# Patient Record
Sex: Male | Born: 2000 | Race: Black or African American | Hispanic: No | Marital: Single | Smoking: Never smoker
Health system: Southern US, Community
[De-identification: ages and names within clinical notes are randomized; demographics above are authoritative.]

## PROBLEM LIST (undated history)

## (undated) HISTORY — PX: ADENOIDECTOMY: SUR15

## (undated) HISTORY — PX: TYMPANOSTOMY TUBE PLACEMENT: SHX32

## (undated) HISTORY — PX: TONSILLECTOMY: SUR1361

---
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
## 2023-01-19 ENCOUNTER — Ambulatory Visit (HOSPITAL_COMMUNITY)
Admission: EM | Admit: 2023-01-19 | Discharge: 2023-01-20 | Disposition: A | Discharge status: Home / Self Care | Payer: No Payment, Other | Attending: Family | Admitting: Family

## 2023-01-19 DIAGNOSIS — F19959 Other psychoactive substance use, unspecified with psychoactive substance-induced psychotic disorder, unspecified: Secondary | ICD-10-CM | POA: Insufficient documentation

## 2023-01-19 LAB — LIPID PANEL
Cholesterol: 223 mg/dL — ABNORMAL HIGH (ref 0–200)
HDL: 79 mg/dL (ref >40)
LDL Cholesterol: 131 mg/dL — ABNORMAL HIGH (ref 0–99)
Total CHOL/HDL Ratio: 2.8 RATIO
Triglycerides: 63 mg/dL (ref <150)
VLDL: 13 mg/dL (ref 0–40)

## 2023-01-19 LAB — CBC WITH DIFFERENTIAL/PLATELET
Abs Immature Granulocytes: 0.02 10*3/uL (ref 0.00–0.07)
Basophils Absolute: 0 10*3/uL (ref 0.0–0.1)
Basophils Relative: 0 %
Eosinophils Absolute: 0 10*3/uL (ref 0.0–0.5)
Eosinophils Relative: 0 %
HCT: 44.1 % (ref 39.0–52.0)
Hemoglobin: 15.5 g/dL (ref 13.0–17.0)
Immature Granulocytes: 0 %
Lymphocytes Relative: 6 %
Lymphs Abs: 0.4 10*3/uL — ABNORMAL LOW (ref 0.7–4.0)
MCH: 32.2 pg (ref 26.0–34.0)
MCHC: 35.1 g/dL (ref 30.0–36.0)
MCV: 91.7 fL (ref 80.0–100.0)
Monocytes Absolute: 0.2 10*3/uL (ref 0.1–1.0)
Monocytes Relative: 3 %
Neutro Abs: 6.5 10*3/uL (ref 1.7–7.7)
Neutrophils Relative %: 91 %
Platelets: 173 10*3/uL (ref 150–400)
RBC: 4.81 MIL/uL (ref 4.22–5.81)
RDW: 13.6 % (ref 11.5–15.5)
WBC: 7.2 10*3/uL (ref 4.0–10.5)
nRBC: 0 % (ref 0.0–0.2)

## 2023-01-19 LAB — COMPREHENSIVE METABOLIC PANEL
ALT: 45 U/L — ABNORMAL HIGH (ref 0–44)
AST: 22 U/L (ref 15–41)
Albumin: 4.4 g/dL (ref 3.5–5.0)
Alkaline Phosphatase: 66 U/L (ref 38–126)
Anion gap: 11 (ref 5–15)
BUN: 10 mg/dL (ref 6–20)
CO2: 24 mmol/L (ref 22–32)
Calcium: 9.3 mg/dL (ref 8.9–10.3)
Chloride: 102 mmol/L (ref 98–111)
Creatinine, Ser: 0.84 mg/dL (ref 0.61–1.24)
GFR, Estimated: >60 mL/min (ref >60)
Glucose, Bld: 107 mg/dL — ABNORMAL HIGH (ref 70–99)
Potassium: 3.8 mmol/L (ref 3.5–5.1)
Sodium: 137 mmol/L (ref 135–145)
Total Bilirubin: 0.9 mg/dL (ref 0.3–1.2)
Total Protein: 7.4 g/dL (ref 6.5–8.1)

## 2023-01-19 LAB — TSH: TSH: 1.94 u[IU]/mL (ref 0.350–4.500)

## 2023-01-19 MED ORDER — OLANZAPINE 2.5 MG PO TABS: 2.5000 mg | ORAL_TABLET | ORAL | Status: DC

## 2023-01-19 MED ORDER — ACETAMINOPHEN 325 MG PO TABS: 650.0000 mg | ORAL_TABLET | Freq: Four times a day (QID) | ORAL | Status: DC | PRN

## 2023-01-19 MED ORDER — ALUM & MAG HYDROXIDE-SIMETH 200-200-20 MG/5ML PO SUSP: 30.0000 mL | ORAL | Status: DC | PRN

## 2023-01-19 MED ORDER — OLANZAPINE 5 MG PO TABS
5.0000 mg | ORAL_TABLET | Freq: Every day | ORAL | Status: DC
Start: 2023-01-20 — End: 2023-01-19

## 2023-01-19 MED ORDER — OLANZAPINE 5 MG PO TABS
5.0000 mg | ORAL_TABLET | ORAL | Status: DC
  Filled 2023-01-19: qty 1

## 2023-01-19 MED ORDER — LORAZEPAM 1 MG PO TABS: 1.0000 mg | ORAL_TABLET | Freq: Once | ORAL | Status: DC

## 2023-01-19 MED ORDER — MAGNESIUM HYDROXIDE 400 MG/5ML PO SUSP: 30.0000 mL | Freq: Every day | ORAL | Status: DC | PRN

## 2023-01-19 MED ORDER — OLANZAPINE 5 MG PO TABS
5.0000 mg | ORAL_TABLET | Freq: Every day | ORAL | Status: DC
  Administered 2023-01-19: 5 mg via ORAL
  Filled 2023-01-19: qty 1

## 2023-01-19 MED ORDER — TRAZODONE HCL 50 MG PO TABS: 50.0000 mg | ORAL_TABLET | Freq: Every evening | ORAL | Status: DC | PRN

## 2023-01-19 NOTE — ED Notes (Signed)
Pt was trying to clime over the glass in the flex, when staff told him to get down he did.
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
## 2023-01-19 NOTE — ED Notes (Signed)
Patient deiced to take Zyprexa. Notified provider.Marland Kitchen

## 2023-01-19 NOTE — ED Notes (Signed)
Minerva Areola was able to get the patients labs.

## 2023-01-19 NOTE — ED Notes (Signed)
Patient in milieu. Environment is secured. Will continue to monitor for safety. 

## 2023-01-19 NOTE — ED Notes (Signed)
Pt is currently sleeping, no distress noted, environmental check complete, will continue to monitor patient for safety.  

## 2023-01-19 NOTE — ED Notes (Signed)
Rn called lab to see why labs had not resulted. States that they had not receive the blood. Rn called dash dash states that hey dropped the blood off.at 1630. States that they had put blood in a cytology bin. Lab looked in bin and  states that they do have they blood now so they will run the labs.

## 2023-01-19 NOTE — ED Notes (Signed)
[facility] lab courier called to transport labs to [facility] lab 

## 2023-01-19 NOTE — ED Notes (Signed)
Pts mother Bonita Quin can be reached at [phone].

## 2023-01-19 NOTE — ED Notes (Signed)
Provider is aware that patient did not have EKG.

## 2023-01-19 NOTE — ED Provider Notes (Signed)
Faxed IVC to magistrate, however it was rejected twice. Envelop# [id number] and [id number]

## 2023-01-19 NOTE — ED Notes (Signed)
Patient states that the can not void at this time.

## 2023-01-19 NOTE — ED Notes (Signed)
Pt  admitted to obs. Denies  SI/HI endorses /AVH. Calm, cooperative throughout interview process. Skin assessment completed. Oriented to unit. Meal and drink offer. Pt verbally contacted for safety. Will monitor for safety.Items are in locker 17

## 2023-01-19 NOTE — ED Notes (Signed)
Patient refused zyprexa . Rn notified  provider.

## 2023-01-20 MED ORDER — OLANZAPINE 5 MG PO TBDP: 5.0000 mg | ORAL_TABLET | Freq: Every day | ORAL | 0 refills | Status: DC

## 2023-01-20 NOTE — ED Notes (Signed)
Patient A&O x 4, ambulatory. Patient discharged in no acute distress with mother. Patient denied SI/HI, A/VH upon discharge. Patient and mother verbalized understanding of all discharge instructions explained by staff and provider, to include follow up appointments, RX's and safety plan. Patient reported mood 10/10.  Pt belongings returned to patient from locker #  [id number]. Patient escorted to lobby via staff for transport to destination. Safety maintained.

## 2023-01-20 NOTE — ED Notes (Signed)
Dr. Lucianne Muss and Arlina Robes, MD at  bedside.

## 2023-01-20 NOTE — ED Notes (Signed)
Patient A&Ox3. Patient is pleasant at this time. Patient answers question appropriately at this time. Patient denies SI/HI and AVH. Patient denies any physical complaints when asked. No acute distress noted. Support and encouragement provided. Routine safety checks conducted according to facility protocol. Encouraged patient to notify staff if thoughts of harm toward self or others arise. Patient verbalize understanding and agreement. Will continue to monitor for safety.

## 2023-01-20 NOTE — ED Notes (Signed)
Patient resting quietly in bed with eyes closed. Respirations equal and unlabored, skin warm and dry, NAD. Routine safety checks conducted according to facility protocol. Will continue to monitor for safety.  

## 2023-01-20 NOTE — ED Notes (Signed)
Pt is currently sleeping, no distress noted, environmental check complete, will continue to monitor patient for safety.  
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
## 2023-01-20 NOTE — Discharge Instructions (Addendum)
Follow-up recommendations:  Activity:  Normal, as tolerated Diet:  Per PCP recommendation Continue taking olanzapine 5 mg at night time   Patient is instructed prior to discharge to: Take all medications as prescribed by her mental healthcare provider. Report any adverse effects and/or reactions from the medicines to her outpatient provider promptly. Patient has been instructed & cautioned: To not engage in alcohol and or illegal drug use while on prescription medicines.  In the event of worsening symptoms, patient is instructed to call the crisis hotline at 988, 911 and or go to the nearest ED for appropriate evaluation and treatment of symptoms. To follow-up with her primary care provider for your other medical issues, concerns and or health care needs.  Base on the information you have provided and the presenting issue, outpatient services and resources for have been recommended.  It is imperative that you follow through with treatment recommendations within 5-7 days from the of discharge to mitigate further risk to your safety and mental well-being. A list of referrals has been provided below to get you started.  You are not limited to the list provided.  In case of an urgent crisis, you may contact the Mobile Crisis Unit with [facility] at [phone].    [facility] [address], [place], [address] [phone] phone   New Patient Assessment/Therapy Walk-Ins:  Monday and Wednesday: 8 am until slots are full. Every 1st and 2nd Fridays of the month: 1 pm - 5 pm.  NO ASSESSMENT/THERAPY WALK-INS ON TUESDAYS OR THURSDAYS  New Patient Assessment/Medication Management Walk-Ins:  Monday - Friday:  8 am - 11 am.  For all walk-ins, we ask that you arrive by 7:30 am because patients will be seen in the order of arrival.  Availability is limited; therefore, you may not be seen on the same day that you walk-in.  Our goal is to serve and  meet the needs of our community to the best of our ability.

## 2024-02-27 ENCOUNTER — Ambulatory Visit (INDEPENDENT_AMBULATORY_CARE_PROVIDER_SITE_OTHER)
Admission: EM | Admit: 2024-02-27 | Discharge: 2024-02-28 | Disposition: A | Discharge status: Other Acute Inpatient Hospital

## 2024-02-27 DIAGNOSIS — R45850 Homicidal ideations: Secondary | ICD-10-CM | POA: Insufficient documentation

## 2024-02-27 DIAGNOSIS — F32A Depression, unspecified: Secondary | ICD-10-CM | POA: Insufficient documentation

## 2024-02-27 DIAGNOSIS — F19959 Other psychoactive substance use, unspecified with psychoactive substance-induced psychotic disorder, unspecified: Secondary | ICD-10-CM | POA: Insufficient documentation

## 2024-02-27 DIAGNOSIS — F1994 Other psychoactive substance use, unspecified with psychoactive substance-induced mood disorder: Secondary | ICD-10-CM

## 2024-02-27 DIAGNOSIS — R4689 Other symptoms and signs involving appearance and behavior: Secondary | ICD-10-CM

## 2024-02-27 LAB — POCT URINE DRUG SCREEN - MANUAL ENTRY (I-SCREEN)
POC Amphetamine UR: NOT DETECTED
POC Buprenorphine (BUP): NOT DETECTED
POC Cocaine UR: NOT DETECTED
POC Marijuana UR: NOT DETECTED
POC Methadone UR: NOT DETECTED
POC Methamphetamine UR: NOT DETECTED
POC Morphine: NOT DETECTED
POC Oxazepam (BZO): NOT DETECTED
POC Oxycodone UR: NOT DETECTED
POC Secobarbital (BAR): NOT DETECTED

## 2024-02-27 LAB — COMPREHENSIVE METABOLIC PANEL WITH GFR
ALT: 19 U/L (ref 0–44)
AST: 18 U/L (ref 15–41)
Albumin: 4 g/dL (ref 3.5–5.0)
Alkaline Phosphatase: 71 U/L (ref 38–126)
Anion gap: 11 (ref 5–15)
BUN: 14 mg/dL (ref 6–20)
CO2: 25 mmol/L (ref 22–32)
Calcium: 8.9 mg/dL (ref 8.9–10.3)
Chloride: 104 mmol/L (ref 98–111)
Creatinine, Ser: 0.92 mg/dL (ref 0.61–1.24)
GFR, Estimated: >60 mL/min (ref >60)
Glucose, Bld: 72 mg/dL (ref 70–99)
Potassium: 3.7 mmol/L (ref 3.5–5.1)
Sodium: 140 mmol/L (ref 135–145)
Total Bilirubin: 0.7 mg/dL (ref 0.0–1.2)
Total Protein: 7.1 g/dL (ref 6.5–8.1)

## 2024-02-27 LAB — CBC WITH DIFFERENTIAL/PLATELET
Abs Immature Granulocytes: 0.02 K/uL (ref 0.00–0.07)
Basophils Absolute: 0 K/uL (ref 0.0–0.1)
Basophils Relative: 1 %
Eosinophils Absolute: 0 K/uL (ref 0.0–0.5)
Eosinophils Relative: 1 %
HCT: 45.1 % (ref 39.0–52.0)
Hemoglobin: 15.5 g/dL (ref 13.0–17.0)
Immature Granulocytes: 0 %
Lymphocytes Relative: 20 %
Lymphs Abs: 1.2 K/uL (ref 0.7–4.0)
MCH: 31.2 pg (ref 26.0–34.0)
MCHC: 34.4 g/dL (ref 30.0–36.0)
MCV: 90.7 fL (ref 80.0–100.0)
Monocytes Absolute: 0.5 K/uL (ref 0.1–1.0)
Monocytes Relative: 8 %
Neutro Abs: 4.1 K/uL (ref 1.7–7.7)
Neutrophils Relative %: 70 %
Platelets: 162 K/uL (ref 150–400)
RBC: 4.97 MIL/uL (ref 4.22–5.81)
RDW: 12.9 % (ref 11.5–15.5)
WBC: 5.9 K/uL (ref 4.0–10.5)
nRBC: 0 % (ref 0.0–0.2)

## 2024-02-27 LAB — LIPID PANEL
Cholesterol: 214 mg/dL — ABNORMAL HIGH (ref 0–200)
HDL: 65 mg/dL (ref >40)
LDL Cholesterol: 131 mg/dL — ABNORMAL HIGH (ref 0–99)
Total CHOL/HDL Ratio: 3.3 ratio
Triglycerides: 90 mg/dL (ref <150)
VLDL: 18 mg/dL (ref 0–40)

## 2024-02-27 LAB — ETHANOL: Alcohol, Ethyl (B): <15 mg/dL (ref <15)

## 2024-02-27 MED ORDER — DIPHENHYDRAMINE HCL 50 MG/ML IJ SOLN: 50.0000 mg | Freq: Three times a day (TID) | INTRAMUSCULAR | Status: DC | PRN

## 2024-02-27 MED ORDER — HALOPERIDOL LACTATE 5 MG/ML IJ SOLN: 10.0000 mg | Freq: Three times a day (TID) | INTRAMUSCULAR | Status: DC | PRN

## 2024-02-27 MED ORDER — TRAZODONE HCL 50 MG PO TABS: 50.0000 mg | ORAL_TABLET | Freq: Every evening | ORAL | Status: DC | PRN

## 2024-02-27 MED ORDER — DIPHENHYDRAMINE HCL 50 MG PO CAPS: 50.0000 mg | ORAL_CAPSULE | Freq: Three times a day (TID) | ORAL | Status: DC | PRN

## 2024-02-27 MED ORDER — HYDROXYZINE HCL 25 MG PO TABS: 25.0000 mg | ORAL_TABLET | Freq: Three times a day (TID) | ORAL | Status: DC | PRN

## 2024-02-27 MED ORDER — HALOPERIDOL 5 MG PO TABS: 5.0000 mg | ORAL_TABLET | Freq: Three times a day (TID) | ORAL | Status: DC | PRN

## 2024-02-27 MED ORDER — LORAZEPAM 2 MG/ML IJ SOLN: 2.0000 mg | Freq: Three times a day (TID) | INTRAMUSCULAR | Status: DC | PRN

## 2024-02-27 MED ORDER — ALUM & MAG HYDROXIDE-SIMETH 200-200-20 MG/5ML PO SUSP: 30.0000 mL | ORAL | Status: DC | PRN

## 2024-02-27 MED ORDER — HALOPERIDOL LACTATE 5 MG/ML IJ SOLN: 5.0000 mg | Freq: Three times a day (TID) | INTRAMUSCULAR | Status: DC | PRN

## 2024-02-27 MED ORDER — ACETAMINOPHEN 325 MG PO TABS: 650.0000 mg | ORAL_TABLET | Freq: Four times a day (QID) | ORAL | Status: DC | PRN

## 2024-02-27 MED ORDER — MAGNESIUM HYDROXIDE 400 MG/5ML PO SUSP: 30.0000 mL | Freq: Every day | ORAL | Status: DC | PRN

## 2024-02-27 MED ORDER — OLANZAPINE 5 MG PO TBDP
5.0000 mg | ORAL_TABLET | Freq: Every day | ORAL | Status: DC
  Administered 2024-02-27: 5 mg via ORAL
  Filled 2024-02-27: qty 1

## 2024-02-27 NOTE — ED Notes (Signed)
 Patient currently resting in recliner. RR even and unlabored, appearing in no noted distress. Environmental check complete

## 2024-02-27 NOTE — ED Notes (Addendum)
 Mario Perez arrived to the [facility] under IVC. Patient denies any suicidal ideations or homicidal ideations. Pt contracts for safety. Patient denies any auditory hallucinations, visual hallucinations, or CAH. Skin check conducted by Corean PEAK and Ellouise, MHT. Patient oriented to the unit and offered food and Patient refused. Patient offered cup of water. Patient currently resting in bed. No distress noted.
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
## 2024-02-28 ENCOUNTER — Encounter (HOSPITAL_COMMUNITY): Payer: Self-pay

## 2024-02-28 ENCOUNTER — Other Ambulatory Visit: Payer: Self-pay

## 2024-02-28 ENCOUNTER — Inpatient Hospital Stay (HOSPITAL_COMMUNITY)
Admission: AD | Admit: 2024-02-28 | Discharge: 2024-03-03 | DRG: 885 | Disposition: A | Discharge status: Home / Self Care | Source: Intra-hospital | Attending: Student in an Organized Health Care Education/Training Program | Admitting: Student in an Organized Health Care Education/Training Program

## 2024-02-28 DIAGNOSIS — Z825 Family history of asthma and other chronic lower respiratory diseases: Secondary | ICD-10-CM

## 2024-02-28 DIAGNOSIS — R45850 Homicidal ideations: Secondary | ICD-10-CM | POA: Diagnosis present

## 2024-02-28 DIAGNOSIS — F1994 Other psychoactive substance use, unspecified with psychoactive substance-induced mood disorder: Principal | ICD-10-CM | POA: Diagnosis present

## 2024-02-28 DIAGNOSIS — Z79899 Other long term (current) drug therapy: Secondary | ICD-10-CM

## 2024-02-28 DIAGNOSIS — Z62810 Personal history of physical and sexual abuse in childhood: Secondary | ICD-10-CM | POA: Diagnosis not present

## 2024-02-28 DIAGNOSIS — Z800 Family history of malignant neoplasm of digestive organs: Secondary | ICD-10-CM | POA: Diagnosis not present

## 2024-02-28 DIAGNOSIS — F39 Unspecified mood [affective] disorder: Secondary | ICD-10-CM | POA: Diagnosis present

## 2024-02-28 DIAGNOSIS — F1220 Cannabis dependence, uncomplicated: Secondary | ICD-10-CM | POA: Diagnosis present

## 2024-02-28 DIAGNOSIS — Z560 Unemployment, unspecified: Secondary | ICD-10-CM | POA: Diagnosis not present

## 2024-02-28 DIAGNOSIS — Z833 Family history of diabetes mellitus: Secondary | ICD-10-CM

## 2024-02-28 DIAGNOSIS — F419 Anxiety disorder, unspecified: Secondary | ICD-10-CM | POA: Diagnosis present

## 2024-02-28 DIAGNOSIS — Z638 Other specified problems related to primary support group: Secondary | ICD-10-CM | POA: Diagnosis not present

## 2024-02-28 DIAGNOSIS — R4689 Other symptoms and signs involving appearance and behavior: Secondary | ICD-10-CM | POA: Diagnosis not present

## 2024-02-28 DIAGNOSIS — Z8249 Family history of ischemic heart disease and other diseases of the circulatory system: Secondary | ICD-10-CM | POA: Diagnosis not present

## 2024-02-28 DIAGNOSIS — F209 Schizophrenia, unspecified: Secondary | ICD-10-CM | POA: Diagnosis present

## 2024-02-28 DIAGNOSIS — F4310 Post-traumatic stress disorder, unspecified: Secondary | ICD-10-CM | POA: Diagnosis present

## 2024-02-28 DIAGNOSIS — Z62820 Parent-biological child conflict: Secondary | ICD-10-CM | POA: Diagnosis not present

## 2024-02-28 DIAGNOSIS — Z62811 Personal history of psychological abuse in childhood: Secondary | ICD-10-CM

## 2024-02-28 DIAGNOSIS — Z5982 Transportation insecurity: Secondary | ICD-10-CM

## 2024-02-28 DIAGNOSIS — F17290 Nicotine dependence, other tobacco product, uncomplicated: Secondary | ICD-10-CM | POA: Diagnosis present

## 2024-02-28 DIAGNOSIS — F32A Depression, unspecified: Secondary | ICD-10-CM | POA: Diagnosis present

## 2024-02-28 DIAGNOSIS — J45909 Unspecified asthma, uncomplicated: Secondary | ICD-10-CM | POA: Diagnosis present

## 2024-02-28 MED ORDER — TRAZODONE HCL 50 MG PO TABS
50.0000 mg | ORAL_TABLET | Freq: Every evening | ORAL | Status: DC | PRN
  Filled 2024-02-28: qty 1

## 2024-02-28 MED ORDER — HALOPERIDOL LACTATE 5 MG/ML IJ SOLN: 10.0000 mg | Freq: Three times a day (TID) | INTRAMUSCULAR | Status: DC | PRN

## 2024-02-28 MED ORDER — ACETAMINOPHEN 325 MG PO TABS: 650.0000 mg | ORAL_TABLET | Freq: Four times a day (QID) | ORAL | Status: DC | PRN

## 2024-02-28 MED ORDER — DIPHENHYDRAMINE HCL 50 MG/ML IJ SOLN: 50.0000 mg | Freq: Three times a day (TID) | INTRAMUSCULAR | Status: DC | PRN

## 2024-02-28 MED ORDER — HALOPERIDOL 5 MG PO TABS: 5.0000 mg | ORAL_TABLET | Freq: Three times a day (TID) | ORAL | Status: DC | PRN

## 2024-02-28 MED ORDER — TRAZODONE HCL 50 MG PO TABS: 50.0000 mg | ORAL_TABLET | Freq: Every evening | ORAL | Status: DC | PRN

## 2024-02-28 MED ORDER — HALOPERIDOL LACTATE 5 MG/ML IJ SOLN: 5.0000 mg | Freq: Three times a day (TID) | INTRAMUSCULAR | Status: DC | PRN

## 2024-02-28 MED ORDER — MAGNESIUM HYDROXIDE 400 MG/5ML PO SUSP: 30.0000 mL | Freq: Every day | ORAL | Status: DC | PRN

## 2024-02-28 MED ORDER — DIPHENHYDRAMINE HCL 25 MG PO CAPS: 50.0000 mg | ORAL_CAPSULE | Freq: Three times a day (TID) | ORAL | Status: DC | PRN

## 2024-02-28 MED ORDER — OLANZAPINE 5 MG PO TBDP
5.0000 mg | ORAL_TABLET | Freq: Every day | ORAL | Status: DC
  Filled 2024-02-28: qty 1

## 2024-02-28 MED ORDER — LORAZEPAM 2 MG/ML IJ SOLN: 2.0000 mg | Freq: Three times a day (TID) | INTRAMUSCULAR | Status: DC | PRN

## 2024-02-28 MED ORDER — ALUM & MAG HYDROXIDE-SIMETH 200-200-20 MG/5ML PO SUSP: 30.0000 mL | ORAL | Status: DC | PRN

## 2024-02-28 MED ORDER — HYDROXYZINE HCL 25 MG PO TABS
25.0000 mg | ORAL_TABLET | Freq: Three times a day (TID) | ORAL | Status: DC | PRN
  Filled 2024-02-28: qty 1
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
## 2024-02-28 NOTE — ED Notes (Signed)
 Patient has been quiet and calm all day.  He has been asleep most of the day awaking for lunch.  Patient to be transferred to [facility] later this evening.  Will monitor.
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
## 2024-02-28 NOTE — ED Notes (Signed)
 Patient currently sleeping and resting in recliner. RR even and unlabored, appearing in no noted distress. Environmental check complete

## 2024-02-28 NOTE — Progress Notes (Signed)
 BHH/BMU LCSW Progress Note   02/28/2024    10:55 AM  Mario Perez   [id number]   Type of Contact and Topic:  Psychiatric Bed Placement   Pt accepted to [facility] 405-2    Patient meets inpatient criteria per Roxianne Olp, NP     The attending provider will be Dr. Prentis  Call report to [phone]    Berwyn Acosta, RN @ [facility] ED notified.     Pt scheduled  to arrive at [facility] TODAY @ 2000.    Bunnie Gallop, MSW, LCSW-A  10:56 AM 02/28/2024

## 2024-02-28 NOTE — Plan of Care (Signed)
  Problem: Education: Goal: Knowledge of [facility] Education information/materials will improve Outcome: Progressing Goal: Emotional status will improve Outcome: Progressing Goal: Mental status will improve Outcome: Progressing Goal: Verbalization of understanding the information provided will improve Outcome: Progressing   Problem: Activity: Goal: Interest or engagement in activities will improve Outcome: Progressing Goal: Sleeping patterns will improve Outcome: Progressing   Problem: Coping: Goal: Ability to verbalize frustrations and anger appropriately will improve Outcome: Progressing Goal: Ability to demonstrate self-control will improve Outcome: Progressing   Problem: Health Behavior/Discharge Planning: Goal: Identification of resources available to assist in meeting health care needs will improve Outcome: Progressing Goal: Compliance with treatment plan for underlying cause of condition will improve Outcome: Progressing   Problem: Physical Regulation: Goal: Ability to maintain clinical measurements within normal limits will improve Outcome: Progressing   Problem: Safety: Goal: Periods of time without injury will increase Outcome: Progressing   Problem: Education: Goal: Knowledge of [facility] Education information/materials will improve Outcome: Progressing Goal: Emotional status will improve Outcome: Progressing Goal: Mental status will improve Outcome: Progressing Goal: Verbalization of understanding the information provided will improve Outcome: Progressing   Problem: Activity: Goal: Interest or engagement in activities will improve Outcome: Progressing Goal: Sleeping patterns will improve Outcome: Progressing   Problem: Coping: Goal: Ability to verbalize frustrations and anger appropriately will improve Outcome: Progressing Goal: Ability to demonstrate self-control will improve Outcome: Progressing   Problem: Health  Behavior/Discharge Planning: Goal: Identification of resources available to assist in meeting health care needs will improve Outcome: Progressing Goal: Compliance with treatment plan for underlying cause of condition will improve Outcome: Progressing   Problem: Physical Regulation: Goal: Ability to maintain clinical measurements within normal limits will improve Outcome: Progressing   Problem: Safety: Goal: Periods of time without injury will increase Outcome: Progressing

## 2024-02-28 NOTE — ED Notes (Signed)
 Pt is reports being irritable and frustrated about being in facility.  Denies SI,HI, AVH, and pain.  Reported he will not  be taking his evening medications tonight.  He is aware he will be going to [facility].  Report has been called and [employer] will transport.  Will continue to monitor for safety and escalating behaviors.

## 2024-02-28 NOTE — Progress Notes (Signed)
 Admission Note patient is a  IVC 23 year old male. Pt was calm and cooperative during assessment. Denies SI/HI/AVH. Admission plan of care reviewed with pt, consent signed.  Personal belongings/skin assessment completed.   No contraband found.  Patient oriented to the unit, staff and room.  Routine safety checks initiated.  Verbalizes understanding of unit rules and protocols.  Patient is presently safe on the unit. No unsafe behaviors noted.  Q 15 minute safety checks maintained per unit protocol.

## 2024-02-29 DIAGNOSIS — F209 Schizophrenia, unspecified: Principal | ICD-10-CM | POA: Diagnosis present

## 2024-02-29 DIAGNOSIS — F1220 Cannabis dependence, uncomplicated: Secondary | ICD-10-CM | POA: Diagnosis present

## 2024-02-29 DIAGNOSIS — F39 Unspecified mood [affective] disorder: Principal | ICD-10-CM | POA: Diagnosis present

## 2024-02-29 LAB — HEMOGLOBIN A1C
Hgb A1c MFr Bld: 5.6 % (ref 4.8–5.6)
Mean Plasma Glucose: 114.02 mg/dL

## 2024-02-29 LAB — TSH: TSH: 1.776 u[IU]/mL (ref 0.350–4.500)

## 2024-02-29 NOTE — Group Note (Signed)
 Date:  02/29/2024 Time:  8:22 PM  Group Topic/Focus:  Goals Group:   The focus of this group is to help patients establish daily goals to achieve during treatment and discuss how the patient can incorporate goal setting into their daily lives to aide in recovery. Wrap-Up Group:   The focus of this group is to help patients review their daily goal of treatment and discuss progress on daily workbooks.    Participation Level:  Did Not Attend  Participation Quality:    Affect:    Cognitive:    Insight:   Engagement in Group:    Modes of Intervention:    Additional Comments:    Mario Perez 02/29/2024, 8:22 PM
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
## 2024-02-29 NOTE — Group Note (Signed)
 Date:  02/29/2024 Time:  5:33 PM  Group Topic/Focus:  Emotional Education:   The focus of this group is to discuss what feelings/emotions are, and how they are experienced.    Participation Level:  Did Not Attend      Huel Mall 02/29/2024, 5:33 PM

## 2024-02-29 NOTE — Plan of Care (Signed)
   Problem: Education: Goal: Knowledge of [facility] Education information/materials will improve Outcome: Progressing Goal: Emotional status will improve Outcome: Progressing Goal: Mental status will improve Outcome: Progressing Goal: Verbalization of understanding the information provided will improve Outcome: Progressing
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
## 2024-02-29 NOTE — Group Note (Deleted)
 Date:  02/29/2024 Time:  5:45 PM  Group Topic/Focus:  Emotional Education:   The focus of this group is to discuss what feelings/emotions are, and how they are experienced.     Participation Level:  {BHH PARTICIPATION OZCZO:77735}  Participation Quality:  {BHH PARTICIPATION QUALITY:22265}  Affect:  {BHH AFFECT:22266}  Cognitive:  {BHH COGNITIVE:22267}  Insight: {BHH Insight2:20797}  Engagement in Group:  {BHH ENGAGEMENT IN HMNLE:77731}  Modes of Intervention:  {BHH MODES OF INTERVENTION:22269}  Additional Comments:  ***  Mario Perez 02/29/2024, 5:45 PM

## 2024-02-29 NOTE — Progress Notes (Signed)
   02/29/24 0831  Psych Admission Type (Psych Patients Only)  Admission Status Involuntary  Psychosocial Assessment  Patient Complaints Irritability  Eye Contact Brief  Facial Expression Blank  Affect Flat  Speech Soft  Interaction Minimal  Motor Activity Other (Comment) (WDL)  Appearance/Hygiene Unremarkable  Behavior Characteristics Cooperative  Mood Irritable  Thought Process  Coherency WDL  Content WDL  Delusions None reported or observed  Perception WDL  Hallucination None reported or observed  Judgment WDL  Confusion WDL  Danger to Self  Current suicidal ideation? Denies  Agreement Not to Harm Self Yes  Description of Agreement verbal  Danger to Others  Danger to Others None reported or observed
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
## 2024-03-01 ENCOUNTER — Encounter (HOSPITAL_COMMUNITY): Payer: Self-pay

## 2024-03-01 DIAGNOSIS — F209 Schizophrenia, unspecified: Secondary | ICD-10-CM

## 2024-03-01 MED ORDER — FLUOXETINE HCL 10 MG PO CAPS
10.0000 mg | ORAL_CAPSULE | Freq: Every day | ORAL | Status: DC
  Administered 2024-03-01: 10 mg via ORAL
  Filled 2024-03-01 (×3): qty 1

## 2024-03-01 NOTE — Group Note (Signed)
 Date:  03/01/2024 Time:  12:01 PM  Group Topic/Focus:  Emotional and Physical Wellness Education:   The purpose of this group is to identify and challenge unhealthy thought patterns key to emotional wellness. The connection between emotional and physical wellness was also discussed.     Participation Level:  Minimal  Participation Quality:  Appropriate  Affect:  Appropriate  Cognitive:  Appropriate  Insight: Limited  Engagement in Group:  Developing/Improving  Modes of Intervention:  Discussion and Education  Additional Comments:    Mario Perez 03/01/2024, 12:01 PM

## 2024-03-01 NOTE — Progress Notes (Addendum)
 Mom called and would like to speak with the MD [phone]. Pt  gave verbal consent for the provider to speak to her. Notified Leita Arts MD Notified MD Arts patient refused Zyprexa  last night.

## 2024-03-01 NOTE — Plan of Care (Signed)
   Problem: Education: Goal: Knowledge of [facility] Education information/materials will improve Outcome: Progressing Goal: Emotional status will improve Outcome: Progressing Goal: Mental status will improve Outcome: Progressing Goal: Verbalization of understanding the information provided will improve Outcome: Progressing

## 2024-03-01 NOTE — [facility] Suicide Risk Assessment (Signed)
 [facility] INPATIENT:  Family/Significant Other Suicide Prevention Education  Suicide Prevention Education:  Education Completed; Halden Phegley (mother) [phone],  (name of family member/significant other) has been identified by the patient as the family member/significant other with whom the patient will be residing, and identified as the person(s) who will aid the patient in the event of a mental health crisis (suicidal ideations/suicide attempt).  With written consent from the patient, the family member/significant other has been provided the following suicide prevention education, prior to the and/or following the discharge of the patient.  The suicide prevention education provided includes the following: Suicide risk factors Suicide prevention and interventions National Suicide Hotline telephone number [facility] assessment telephone number [facility] Emergency Assistance 911 [facility] and/or Residential Mobile Crisis Unit telephone number  Request made of family/significant other to: Remove weapons (e.g., guns, rifles, knives), all items previously/currently identified as safety concern.   Remove drugs/medications (over-the-counter, prescriptions, illicit drugs), all items previously/currently identified as a safety concern.  Mother, Aarya Quebedeaux, states patient owns a gun but she was able to confiscate it and remove it from the home. Patient no longer has access to this gun, no other weapons are in the home. Rock also agrees to safely store/lock up OTC/prescription medications away from patient. Rock agrees to allow patient back to the home (address on file) upon discharge.   The family member/significant other verbalizes understanding of the suicide prevention education information provided.  The family member/significant other agrees to remove the items of safety concern listed above.  Louetta Lame 03/01/2024, 12:57 PM

## 2024-03-01 NOTE — Group Note (Deleted)
 Date:  03/01/2024 Time:  8:07 PM  Group Topic/Focus:       Participation Level:  {[facility] PARTICIPATION OZCZO:77735}  Participation Quality:  {[facility] PARTICIPATION QUALITY:22265}  Affect:  {[facility] AFFECT:22266}  Cognitive:  {[facility] COGNITIVE:22267}  Insight: {[facility] Insight2:20797}  Engagement in Group:  {[facility] ENGAGEMENT IN HMNLE:77731}  Modes of Intervention:  {[facility] MODES OF INTERVENTION:22269}  Additional Comments:  ***  Phoenyx Paulsen A Dontavious Emily 03/01/2024, 8:07 PM

## 2024-03-01 NOTE — Group Note (Signed)
 Recreation Therapy Group Note   Group Topic:Team Building  Group Date: 03/01/2024 Start Time: 0930 End Time: 0958 Facilitators: Mikael Debell-McCall, LRT,CTRS Location: [address] Dayroom   Group Topic: Communication, Team Building, Problem Solving  Goal Area(s) Addresses:  Patient will effectively work with peer towards shared goal.  Patient will identify skills used to make activity successful.  Patient will identify how skills used during activity can be applied to reach post d/c goals.   Behavioral Response:   Intervention: STEM Activity- Glass blower/designer  Activity: Tallest [employer]. In teams of 5-6, patients were given 11 craft pipe cleaners. Using the materials provided, patients were instructed to compete again the opposing team(s) to build the tallest free-standing structure from floor level. The activity was timed; difficulty increased by Clinical research associate as Production designer, theatre/television/film continued.  Systematically resources were removed with additional directions for example, placing one arm behind their back, working in silence, and shape stipulations. LRT facilitated post-activity discussion reviewing team processes and necessary communication skills involved in completion. Patients were encouraged to reflect how the skills utilized, or not utilized, in this activity can be incorporated to positively impact support systems post discharge.  Education: Pharmacist, community, Scientist, physiological, Discharge Planning   Education Outcome: Acknowledges education/In group clarification offered/Needs additional education.    Affect/Mood: N/A   Participation Level: Did not attend    Clinical Observations/Individualized Feedback:      Plan: Continue to engage patient in RT group sessions 2-3x/week.   Liona Wengert-McCall, LRT,CTRS 03/01/2024 12:49 PM
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
## 2024-03-01 NOTE — Group Note (Signed)
 Date:  03/01/2024 Time:  4:00 PM  Group Topic/Focus:  Goals Group:   The focus of this group is to help patients establish daily goals to achieve during treatment and discuss how the patient can incorporate goal setting into their daily lives to aide in recovery. Orientation:   The focus of this group is to educate the patient on the purpose and policies of crisis stabilization and provide a format to answer questions about their admission.  The group details unit policies and expectations of patients while admitted.    Participation Level:  Did Not Attend   Mario Perez Mars 03/01/2024, 4:00 PM

## 2024-03-01 NOTE — Progress Notes (Signed)
(  Sleep Hours) -8.0 (Any PRNs that were needed, meds refused, or side effects to meds)- refused zyprexa  (Any disturbances and when (visitation, over night)-none (Concerns raised by the patient)-Pt does not want to take any medications  (SI/HI/AVH)- Denies all

## 2024-03-01 NOTE — BH IP Treatment Plan (Signed)
 Interdisciplinary Treatment and Diagnostic Plan Update  03/01/2024 Time of Session: 10:35AM Mario Perez MRN: [id number]  Principal Diagnosis: Unspecified mood (affective) disorder (HCC)  Secondary Diagnoses: Principal Problem:   Unspecified mood (affective) disorder (HCC) Active Problems:   Cannabis use disorder, severe, dependence (HCC)   Current Medications:  Current Facility-Administered Medications  Medication Dose Route Frequency Provider Last Rate Last Admin   acetaminophen  (TYLENOL ) tablet 650 mg  650 mg Oral Q6H PRN Nkwenti, Doris, NP       acetaminophen  (TYLENOL ) tablet 650 mg  650 mg Oral Q6H PRN Nkwenti, Doris, NP       alum & mag hydroxide-simeth (MAALOX/MYLANTA) 200-200-20 MG/5ML suspension 30 mL  30 mL Oral Q4H PRN Nkwenti, Doris, NP       alum & mag hydroxide-simeth (MAALOX/MYLANTA) 200-200-20 MG/5ML suspension 30 mL  30 mL Oral Q4H PRN Nkwenti, Doris, NP       haloperidol  (HALDOL ) tablet 5 mg  5 mg Oral TID PRN Tex Drilling, NP       And   diphenhydrAMINE  (BENADRYL ) capsule 50 mg  50 mg Oral TID PRN Tex Drilling, NP       haloperidol  lactate (HALDOL ) injection 10 mg  10 mg Intramuscular TID PRN Tex Drilling, NP       And   diphenhydrAMINE  (BENADRYL ) injection 50 mg  50 mg Intramuscular TID PRN Tex Drilling, NP       And   LORazepam  (ATIVAN ) injection 2 mg  2 mg Intramuscular TID PRN Tex Drilling, NP       haloperidol  lactate (HALDOL ) injection 5 mg  5 mg Intramuscular TID PRN Tex Drilling, NP       And   diphenhydrAMINE  (BENADRYL ) injection 50 mg  50 mg Intramuscular TID PRN Tex Drilling, NP       And   LORazepam  (ATIVAN ) injection 2 mg  2 mg Intramuscular TID PRN Tex Drilling, NP       FLUoxetine  (PROZAC ) capsule 10 mg  10 mg Oral Daily Towana Leita SAILOR, MD   10 mg at 03/01/24 1406   hydrOXYzine  (ATARAX ) tablet 25 mg  25 mg Oral TID PRN Tex Drilling, NP       magnesium  hydroxide (MILK OF MAGNESIA) suspension 30 mL  30 mL Oral Daily PRN Nkwenti,  Doris, NP       magnesium  hydroxide (MILK OF MAGNESIA) suspension 30 mL  30 mL Oral Daily PRN Nkwenti, Doris, NP       traZODone  (DESYREL ) tablet 50 mg  50 mg Oral QHS PRN Nkwenti, Doris, NP       traZODone  (DESYREL ) tablet 50 mg  50 mg Oral QHS PRN Tex Drilling, NP       PTA Medications: Medications Prior to Admission  Medication Sig Dispense Refill Last Dose/Taking   OLANZapine  zydis (ZYPREXA  ZYDIS) 5 MG disintegrating tablet Take 1 tablet (5 mg total) by mouth at bedtime. (Patient not taking: Reported on 02/28/2024) 30 tablet 0     Patient Stressors: Marital or family conflict    Patient Strengths: Supportive family/friends   Treatment Modalities: Medication Management, Group therapy, Case management,  1 to 1 session with clinician, Psychoeducation, Recreational therapy.   Physician Treatment Plan for Primary Diagnosis: Unspecified mood (affective) disorder (HCC) Long Term Goal(s):     Short Term Goals:    Medication Management: Evaluate patient's response, side effects, and tolerance of medication regimen.  Therapeutic Interventions: 1 to 1 sessions, Unit Group sessions and Medication administration.  Evaluation of Outcomes: Not Progressing  Physician Treatment Plan for Secondary Diagnosis: Principal Problem:   Unspecified mood (affective) disorder (HCC) Active Problems:   Cannabis use disorder, severe, dependence (HCC)  Long Term Goal(s):     Short Term Goals:       Medication Management: Evaluate patient's response, side effects, and tolerance of medication regimen.  Therapeutic Interventions: 1 to 1 sessions, Unit Group sessions and Medication administration.  Evaluation of Outcomes: Not Progressing   RN Treatment Plan for Primary Diagnosis: Unspecified mood (affective) disorder (HCC) Long Term Goal(s): Knowledge of disease and therapeutic regimen to maintain health will improve  Short Term Goals: Ability to remain free from injury will improve, Ability to  verbalize frustration and anger appropriately will improve, Ability to demonstrate self-control, Ability to participate in decision making will improve, Ability to verbalize feelings will improve, Ability to disclose and discuss suicidal ideas, and Ability to identify and develop effective coping behaviors will improve  Medication Management: RN will administer medications as ordered by provider, will assess and evaluate patient's response and provide education to patient for prescribed medication. RN will report any adverse and/or side effects to prescribing provider.  Therapeutic Interventions: 1 on 1 counseling sessions, Psychoeducation, Medication administration, Evaluate responses to treatment, Monitor vital signs and CBGs as ordered, Perform/monitor CIWA, COWS, AIMS and Fall Risk screenings as ordered, Perform wound care treatments as ordered.  Evaluation of Outcomes: Not Progressing   LCSW Treatment Plan for Primary Diagnosis: Unspecified mood (affective) disorder (HCC) Long Term Goal(s): Safe transition to appropriate next level of care at discharge, Engage patient in therapeutic group addressing interpersonal concerns.  Short Term Goals: Engage patient in aftercare planning with referrals and resources, Increase social support, Increase ability to appropriately verbalize feelings, Increase emotional regulation, Facilitate acceptance of mental health diagnosis and concerns, Facilitate patient progression through stages of change regarding substance use diagnoses and concerns, and Identify triggers associated with mental health/substance abuse issues  Therapeutic Interventions: Assess for all discharge needs, 1 to 1 time with Social worker, Explore available resources and support systems, Assess for adequacy in community support network, Educate family and significant other(s) on suicide prevention, Complete Psychosocial Assessment, Interpersonal group therapy.  Evaluation of Outcomes: Not  Progressing   Progress in Treatment: Attending groups: No. Participating in groups: No. Taking medication as prescribed: Yes. Toleration medication: Yes. Family/Significant other contact made: Yes, individual(s) contacted:  Mario Perez [phone] Patient understands diagnosis: Yes. Discussing patient identified problems/goals with staff: Yes. Medical problems stabilized or resolved: Yes. Denies suicidal/homicidal ideation: Yes. Issues/concerns per patient self-inventory: No.  Patient Goals:  I don't really have any.  Discharge Plan or Barriers: Pt likely to discharge home with family.  Reason for Continuation of Hospitalization: Medication stabilization  Estimated Length of Stay: 5-7 days  Last 3 [place] Suicide Severity Risk Score: Flowsheet Row Ad[facility] (Current) [facility] from [facility]/27/2024 in [facility] from 01/19/2023 in [facility]  C-SSRS RISK CATEGORY No Risk No Risk No Risk    Last [facility] 2/9 Scores:     No data to display          Scribe for Treatment Team: Reed Eifert M Tenley Winward, LCSWA 03/01/2024 2:40 PM

## 2024-03-01 NOTE — Plan of Care (Signed)
   Problem: Education: Goal: Emotional status will improve Outcome: Not Progressing

## 2024-03-01 NOTE — [facility] Group Notes (Signed)
 Spiritual care group on grief and loss facilitated by Chaplain Dyanne Carrel, [facility]  Group Goal: Support / Education around grief and loss  Members engage in facilitated group support and psycho-social education.  Group Description:  Following introductions and group rules, group members engaged in facilitated group dialogue and support around topic of loss, with particular support around experiences of loss in their lives. Group Identified types of loss (relationships / self / things) and identified patterns, circumstances, and changes that precipitate losses. Reflected on thoughts / feelings around loss, normalized grief responses, and recognized variety in grief experience. Group encouraged individual reflection on safe space and on the coping skills that they are already utilizing.  Group drew on Adlerian / Rogerian and narrative framework  Patient Progress: Mario Perez attended group and actively engaged and participated in group conversation and activities.  Comments demonstrated good insight and contributed positively to the group conversation.

## 2024-03-02 DIAGNOSIS — F1220 Cannabis dependence, uncomplicated: Secondary | ICD-10-CM | POA: Diagnosis not present

## 2024-03-02 DIAGNOSIS — F39 Unspecified mood [affective] disorder: Secondary | ICD-10-CM | POA: Diagnosis not present

## 2024-03-02 NOTE — Progress Notes (Signed)
 Tour of Duty:  Prentice JINNY Angle, RN, 03/02/24, Tour of Duty: 0700-1900  SI/HI/AVH: Denies  Self-Reported   Mood: Negative  Anxiety: Denies Depression: Denies Irritability: Denies, but observable  Broset  Violence Prevention Guidelines *See Row Information*: (not recorded)   LBM  Last BM Date : 02/26/24   Pain: not present  Patient Refusals (including Rx): Yes, including scheduled Rx  Shift Summary: Patient observed to be withdrawn and irritable edge on unit. Patient able to make needs known. Patient observed to engage appropriately with staff and peers. Patient refusing medications as prescribed. This shift, no PRN medication requested or required. No observed or reported side effects to medication. No observed or reported agitation, aggression, or other acute emotional distress. No observed or reported physical abnormalities or concerns.   Last Vitals  Vitals Weight: 69.5 kg Temp: 98 F (36.7 C) Temp Source: Oral Pulse Rate: 64 Resp: 18 BP: 112/68 Patient Position: (not recorded)  Admission Type  Psych Admission Type (Psych Patients Only) Admission Status: Involuntary Date 72 hour document signed : (not recorded) Time 72 hour document signed : (not recorded) Provider Notified (First and Last Name) (see details for LINK to note): (not recorded)   Psychosocial Assessment  Psychosocial Assessment Patient Complaints: None Eye Contact: Brief Facial Expression: Animated, Anxious Affect: Anxious Speech: Logical/coherent Interaction: Evasive, Guarded Motor Activity: Other (Comment) (WDL) Appearance/Hygiene: Unremarkable Behavior Characteristics: Guarded, Resistant to care Mood: Anxious   Aggressive Behavior  Targets: (not recorded)   Thought Process  Thought Process Coherency: Circumstantial Content: Blaming others Delusions: None reported or observed Perception: Within Defined Limits Hallucination: None reported or observed Judgment: Poor Confusion:  None  Danger to Self/Others  Danger to Self Current suicidal ideation?: Denies Description of Suicide Plan: (not recorded) Self-Injurious Behavior: (not recorded) Agreement Not to Harm Self: (not recorded) Description of Agreement: (not recorded) Danger to Others: None reported or observed

## 2024-03-02 NOTE — Plan of Care (Signed)
   Problem: Education: Goal: Emotional status will improve Outcome: Not Progressing Goal: Mental status will improve Outcome: Not Progressing

## 2024-03-02 NOTE — [facility] Group Notes (Signed)
 [facility] Group Notes:  (Nursing/MHT/Case Management/Adjunct)  Date:  03/02/2024  Time:  9:17 PM  Type of Therapy:  Wrap-up group  Participation Level:  Active  Participation Quality:  Appropriate  Affect:  Appropriate  Cognitive:  Appropriate  Insight:  Appropriate  Engagement in Group:  Engaged  Modes of Intervention:  Education  Summary of Progress/Problems: PT goal to interact with people. Met goal, rated his day 19/10.  Grayce LITTIE Essex 03/02/2024, 9:17 PM

## 2024-03-02 NOTE — Progress Notes (Signed)
   03/02/24 2200  Psych Admission Type (Psych Patients Only)  Admission Status Involuntary  Psychosocial Assessment  Patient Complaints None  Eye Contact Brief  Facial Expression Flat  Affect Flat  Speech Logical/coherent  Interaction Minimal;Guarded  Motor Activity  (appropriate)  Appearance/Hygiene Unremarkable  Behavior Characteristics Guarded  Mood Depressed  Thought Process  Coherency Circumstantial  Content Blaming others  Delusions None reported or observed  Perception WDL  Hallucination None reported or observed  Judgment Poor  Confusion None  Danger to Self  Current suicidal ideation? Denies  Agreement Not to Harm Self Yes  Description of Agreement Verbal  Danger to Others  Danger to Others None reported or observed

## 2024-03-02 NOTE — Progress Notes (Signed)
(  Sleep Hours) -9.0 (Any PRNs that were needed, meds refused, or side effects to meds)-none  (Any disturbances and when (visitation, over night)-none (Concerns raised by the patient)- none (SI/HI/AVH)- Senies all

## 2024-03-02 NOTE — [facility] Group Notes (Signed)
 [facility] Group Notes:  (Nursing/MHT/Case Management/Adjunct)  Date:  03/02/2024  Time:  3:19 AM  Type of Therapy:  Wrap-up group  Participation Level:  Did Not Attend  Participation Quality:    Affect:    Cognitive:    Insight:    Engagement in Group:    Modes of Intervention:    Summary of Progress/Problems:  Mario Perez 03/02/2024, 3:19 AM

## 2024-03-02 NOTE — Group Note (Signed)
 Date:  03/02/2024 Time:  9:52 AM  Group Topic/Focus:  Goals Group:   The focus of this group is to help patients establish daily goals to achieve during treatment and discuss how the patient can incorporate goal setting into their daily lives to aide in recovery.    Participation Level:  Active  Participation Quality:  Appropriate  Affect:  Appropriate  Cognitive:  Appropriate  Insight: Appropriate  Engagement in Group:  Engaged  Modes of Intervention:  Discussion  Additional Comments:  pt is being discharged today, heis looking forward to spend time with family and friends  Nat Rummer 03/02/2024, 9:52 AM
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
## 2024-03-02 NOTE — Group Note (Signed)
 Date:  03/02/2024 Time:  3:48 PM  Group Topic/Focus:  Overcoming Stress:   The focus of this group is to define stress and help patients assess their triggers. Self Care:   The focus of this group is to help patients understand the importance of self-care in order to improve or restore emotional, physical, spiritual, interpersonal, and financial health.    Participation Level:  Did Not Attend   Mario Perez 03/02/2024, 3:48 PM

## 2024-03-02 NOTE — Group Note (Signed)
 LCSW Group Therapy Note   Group Date: 03/02/2024 Start Time: 1100 End Time: 1200   Participation:  did not attend  Type of Therapy:  Group Therapy  Topic:  Understanding Your Path to Change  Objective:  The goal is to help individuals understand the stages of change, identify where they currently are in the process, and provide actionable next steps to continue moving forward in their journey of change.  Goals: Learn about the six stages of change:  Precontemplation, Contemplation, Preparation, Action, Maintenance, and Relapse Reflect on Current Change Efforts:  Recognize which stage participants are in regarding a personal change. Plan Next Steps for Moving Forward:  Create an action plan based on their current stage of change.  Group Summary:  In this session, we explored the Stages of Change as a framework to understand the process of change.  We discussed how each stage helps individuals recognize where they are in their personal journey and used the Stages of Change Worksheet for self-reflection. Participants answered questions to better understand their current stage, challenges, and progress. We also emphasized the importance of moving forward, even if setbacks (Relapse) occur, and created actionable steps to help participants continue progressing. By the end of the session, participants gained a clearer understanding of their path to change and left with a clear plan for next steps.  Therapeutic Modalities:  Elements of CBT (cognitive restructuring, problem solving)  Element of DBT (mindfulness, distress tolerance)   Xylah Early O Vang Kraeger, LCSWA 03/02/2024  5:11 PM

## 2024-03-02 NOTE — Group Note (Signed)
 Recreation Therapy Group Note   Group Topic:Animal Assisted Therapy   Group Date: 03/02/2024 Start Time: 0945 End Time: 1030 Facilitators: Delailah Spieth-McCall, LRT,CTRS Location: [address] Dayroom   Animal-Assisted Activity (AAA) Program Checklist/Progress Notes Patient Eligibility Criteria Checklist & Daily Group note for Rec Tx Intervention  AAA/T Program Assumption of Risk Form signed by Patient/ or Parent Legal Guardian Yes  Patient understands his/her participation is voluntary Yes  Behavioral Response:    Education: Charity fundraiser, Appropriate Animal Interaction   Education Outcome: Acknowledges education.    Affect/Mood: N/A   Participation Level: Did not attend    Clinical Observations/Individualized Feedback:      Plan: Continue to engage patient in RT group sessions 2-3x/week.   Sheehan Stacey-McCall, LRT,CTRS 03/02/2024 12:31 PM

## 2024-03-03 DIAGNOSIS — F39 Unspecified mood [affective] disorder: Principal | ICD-10-CM

## 2024-03-03 DIAGNOSIS — F1220 Cannabis dependence, uncomplicated: Secondary | ICD-10-CM

## 2024-03-03 MED ORDER — FLUOXETINE HCL 10 MG PO CAPS: 10.0000 mg | ORAL_CAPSULE | Freq: Every day | ORAL | 0 refills | Status: AC

## 2024-03-03 NOTE — Group Note (Signed)
 Date:  03/03/2024 Time:  9:32 AM  Group Topic/Focus:  Goals Group:   The focus of this group is to help patients establish daily goals to achieve during treatment and discuss how the patient can incorporate goal setting into their daily lives to aide in recovery.    Participation Level:  Did Not Attend  Participation Quality:  n/a  Affect:  n/a  Cognitive:  n/a  Insight: n/a  Engagement in Group:  n/a  Modes of Intervention:  n/a  Additional Comments:  n/a  Nat Rummer 03/03/2024, 9:32 AM

## 2024-03-03 NOTE — Progress Notes (Signed)
(  Sleep Hours) - 8.5 (Any PRNs that were needed, meds refused, or side effects to meds)- N/A (Any disturbances and when (visitation, over night)- N/A (Concerns raised by the patient)- N/A (SI/HI/AVH)- DENIES
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
## 2024-03-03 NOTE — Progress Notes (Signed)
  [facility] Adult Case Management Discharge Plan :  Will you be returning to the same living situation after discharge:  Yes,  pt returning home at discharge At discharge, do you have transportation home?: Yes,  pt will be picked up by brother around 11-1130AM  Do you have the ability to pay for your medications: Yes,  pt has active health insurance coverage  Release of information consent forms completed and in the chart;  Patient's signature needed at discharge.  Patient to Follow up at:  Follow-up Information     [facility] Follow up on 03/10/2024.   Why: You have a hospital discharge appointment for therapy and medication management services on 03/10/24 @ 9:00 am.  This will be a Virtual telehealth appointment. Contact information: [address]  [address] [place] [address] [phone]                 Next level of care provider has access to [facility] Link:no  Safety Planning and Suicide Prevention discussed: Yes,  Mario Perez (mother) [phone],     Has patient been referred to the Quitline?: Patient does not use tobacco/nicotine products  Patient has been referred for addiction treatment: No known substance use disorder.  Mario Perez, LCSWA 03/03/2024, 8:59 AM
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
## 2024-03-03 NOTE — Plan of Care (Signed)
  Problem: Activity: Goal: Sleeping patterns will improve Outcome: Progressing   Problem: Activity: Goal: Interest or engagement in activities will improve Outcome: Not Progressing   Problem: Coping: Goal: Ability to verbalize frustrations and anger appropriately will improve Outcome: Not Progressing   

## 2024-03-03 NOTE — Progress Notes (Signed)
 Patient is discharging at this time. Patient is A&Ox4. Stable. Patient denies SI,HI, and A/V/H with no plan/intent. Printed AVS reviewed with and given to patient along with medications and follow up appointments. Suicide safety plan and survey complete with copy provided to patient. Original form in chart. Patient verbalized all understanding. All valuables/belongings returned to patient. Patient is being transported by his mother. Patient denies any pain/discomfort. No s/s of current distress.

## 2024-07-29 ENCOUNTER — Other Ambulatory Visit (HOSPITAL_COMMUNITY): Payer: Self-pay | Admitting: Student

## 2024-08-04 ENCOUNTER — Ambulatory Visit (HOSPITAL_COMMUNITY)
Admission: EM | Admit: 2024-08-04 | Discharge: 2024-08-04 | Disposition: A | Discharge status: Other Acute Inpatient Hospital | Attending: Psychiatry | Admitting: Psychiatry

## 2024-08-04 ENCOUNTER — Other Ambulatory Visit: Payer: Self-pay

## 2024-08-04 ENCOUNTER — Inpatient Hospital Stay (HOSPITAL_COMMUNITY)
Admission: AD | Admit: 2024-08-04 | Source: Intra-hospital | Attending: Student in an Organized Health Care Education/Training Program | Admitting: Student in an Organized Health Care Education/Training Program

## 2024-08-04 ENCOUNTER — Encounter (HOSPITAL_COMMUNITY): Payer: Self-pay | Admitting: Student

## 2024-08-04 DIAGNOSIS — F1994 Other psychoactive substance use, unspecified with psychoactive substance-induced mood disorder: Principal | ICD-10-CM | POA: Diagnosis present

## 2024-08-04 DIAGNOSIS — F39 Unspecified mood [affective] disorder: Secondary | ICD-10-CM | POA: Diagnosis not present

## 2024-08-04 DIAGNOSIS — R4689 Other symptoms and signs involving appearance and behavior: Secondary | ICD-10-CM | POA: Diagnosis not present

## 2024-08-04 DIAGNOSIS — Z91148 Patient's other noncompliance with medication regimen for other reason: Secondary | ICD-10-CM | POA: Diagnosis not present

## 2024-08-04 DIAGNOSIS — F1220 Cannabis dependence, uncomplicated: Secondary | ICD-10-CM | POA: Diagnosis present

## 2024-08-04 DIAGNOSIS — R45850 Homicidal ideations: Secondary | ICD-10-CM | POA: Insufficient documentation

## 2024-08-04 LAB — COMPREHENSIVE METABOLIC PANEL WITH GFR
ALT: 17 U/L (ref 0–44)
AST: 18 U/L (ref 15–41)
Albumin: 4.4 g/dL (ref 3.5–5.0)
Alkaline Phosphatase: 85 U/L (ref 38–126)
Anion gap: 11 (ref 5–15)
BUN: 15 mg/dL (ref 6–20)
CO2: 25 mmol/L (ref 22–32)
Calcium: 9 mg/dL (ref 8.9–10.3)
Chloride: 104 mmol/L (ref 98–111)
Creatinine, Ser: 0.74 mg/dL (ref 0.61–1.24)
GFR, Estimated: >60 mL/min
Glucose, Bld: 83 mg/dL (ref 70–99)
Potassium: 4.1 mmol/L (ref 3.5–5.1)
Sodium: 140 mmol/L (ref 135–145)
Total Bilirubin: 0.3 mg/dL (ref 0.0–1.2)
Total Protein: 7 g/dL (ref 6.5–8.1)

## 2024-08-04 LAB — CBC WITH DIFFERENTIAL/PLATELET
Abs Immature Granulocytes: 0.02 10*3/uL (ref 0.00–0.07)
Basophils Absolute: 0 10*3/uL (ref 0.0–0.1)
Basophils Relative: 0 %
Eosinophils Absolute: 0 10*3/uL (ref 0.0–0.5)
Eosinophils Relative: 0 %
HCT: 43.8 % (ref 39.0–52.0)
Hemoglobin: 15.5 g/dL (ref 13.0–17.0)
Immature Granulocytes: 0 %
Lymphocytes Relative: 14 %
Lymphs Abs: 0.9 10*3/uL (ref 0.7–4.0)
MCH: 31.8 pg (ref 26.0–34.0)
MCHC: 35.4 g/dL (ref 30.0–36.0)
MCV: 89.9 fL (ref 80.0–100.0)
Monocytes Absolute: 0.4 10*3/uL (ref 0.1–1.0)
Monocytes Relative: 7 %
Neutro Abs: 5.1 10*3/uL (ref 1.7–7.7)
Neutrophils Relative %: 79 %
Platelets: 161 10*3/uL (ref 150–400)
RBC: 4.87 MIL/uL (ref 4.22–5.81)
RDW: 13.3 % (ref 11.5–15.5)
WBC: 6.5 10*3/uL (ref 4.0–10.5)
nRBC: 0 % (ref 0.0–0.2)

## 2024-08-04 LAB — POCT URINE DRUG SCREEN - MANUAL ENTRY (I-SCREEN)
POC Amphetamine UR: NOT DETECTED
POC Buprenorphine (BUP): NOT DETECTED
POC Cocaine UR: NOT DETECTED
POC Marijuana UR: POSITIVE — AB
POC Methadone UR: NOT DETECTED
POC Methamphetamine UR: NOT DETECTED
POC Morphine: NOT DETECTED
POC Oxazepam (BZO): NOT DETECTED
POC Oxycodone UR: NOT DETECTED
POC Secobarbital (BAR): NOT DETECTED

## 2024-08-04 MED ORDER — NICOTINE 14 MG/24HR TD PT24
14.0000 mg | MEDICATED_PATCH | Freq: Every day | TRANSDERMAL | Status: AC
  Filled 2024-08-04 (×4): qty 1

## 2024-08-04 MED ORDER — MAGNESIUM HYDROXIDE 400 MG/5ML PO SUSP: 30.0000 mL | Freq: Every day | ORAL | Status: AC | PRN

## 2024-08-04 MED ORDER — ACETAMINOPHEN 325 MG PO TABS
650.0000 mg | ORAL_TABLET | Freq: Four times a day (QID) | ORAL | Status: AC | PRN
  Administered 2024-08-11: 650 mg via ORAL
  Filled 2024-08-04: qty 2

## 2024-08-04 MED ORDER — HALOPERIDOL LACTATE 5 MG/ML IJ SOLN
5.0000 mg | Freq: Three times a day (TID) | INTRAMUSCULAR | Status: AC | PRN
  Filled 2024-08-04: qty 1

## 2024-08-04 MED ORDER — HALOPERIDOL 5 MG PO TABS
5.0000 mg | ORAL_TABLET | Freq: Three times a day (TID) | ORAL | Status: AC | PRN
  Filled 2024-08-04: qty 1

## 2024-08-04 MED ORDER — TRAZODONE HCL 50 MG PO TABS
50.0000 mg | ORAL_TABLET | Freq: Every evening | ORAL | Status: AC | PRN
  Filled 2024-08-04: qty 1

## 2024-08-04 MED ORDER — ALUM & MAG HYDROXIDE-SIMETH 200-200-20 MG/5ML PO SUSP: 30.0000 mL | ORAL | Status: DC | PRN

## 2024-08-04 MED ORDER — LORAZEPAM 2 MG/ML IJ SOLN: 2.0000 mg | Freq: Three times a day (TID) | INTRAMUSCULAR | Status: DC | PRN

## 2024-08-04 MED ORDER — ALUM & MAG HYDROXIDE-SIMETH 200-200-20 MG/5ML PO SUSP: 30.0000 mL | ORAL | Status: AC | PRN

## 2024-08-04 MED ORDER — DIPHENHYDRAMINE HCL 50 MG/ML IJ SOLN: 50.0000 mg | Freq: Three times a day (TID) | INTRAMUSCULAR | Status: DC | PRN

## 2024-08-04 MED ORDER — DIPHENHYDRAMINE HCL 50 MG/ML IJ SOLN: 50.0000 mg | Freq: Three times a day (TID) | INTRAMUSCULAR | Status: AC | PRN

## 2024-08-04 MED ORDER — ACETAMINOPHEN 325 MG PO TABS: 650.0000 mg | ORAL_TABLET | Freq: Four times a day (QID) | ORAL | Status: DC | PRN

## 2024-08-04 MED ORDER — NICOTINE 14 MG/24HR TD PT24: 14.0000 mg | MEDICATED_PATCH | Freq: Every day | TRANSDERMAL | Status: DC

## 2024-08-04 MED ORDER — MAGNESIUM HYDROXIDE 400 MG/5ML PO SUSP: 30.0000 mL | Freq: Every day | ORAL | Status: DC | PRN

## 2024-08-04 MED ORDER — DIPHENHYDRAMINE HCL 50 MG/ML IJ SOLN
50.0000 mg | Freq: Three times a day (TID) | INTRAMUSCULAR | Status: AC | PRN
  Filled 2024-08-04: qty 1

## 2024-08-04 MED ORDER — DIPHENHYDRAMINE HCL 25 MG PO CAPS: 50.0000 mg | ORAL_CAPSULE | Freq: Three times a day (TID) | ORAL | Status: AC | PRN

## 2024-08-04 MED ORDER — LORAZEPAM 2 MG/ML IJ SOLN
2.0000 mg | Freq: Three times a day (TID) | INTRAMUSCULAR | Status: AC | PRN
  Filled 2024-08-04: qty 1

## 2024-08-04 MED ORDER — HYDROXYZINE HCL 25 MG PO TABS
25.0000 mg | ORAL_TABLET | Freq: Three times a day (TID) | ORAL | Status: AC | PRN
  Administered 2024-08-12: 25 mg via ORAL
  Filled 2024-08-04 (×2): qty 1

## 2024-08-04 MED ORDER — DIPHENHYDRAMINE HCL 50 MG PO CAPS: 50.0000 mg | ORAL_CAPSULE | Freq: Three times a day (TID) | ORAL | Status: DC | PRN

## 2024-08-04 MED ORDER — LORAZEPAM 2 MG/ML IJ SOLN: 2.0000 mg | Freq: Three times a day (TID) | INTRAMUSCULAR | Status: AC | PRN

## 2024-08-04 MED ORDER — HALOPERIDOL 5 MG PO TABS: 5.0000 mg | ORAL_TABLET | Freq: Three times a day (TID) | ORAL | Status: DC | PRN

## 2024-08-04 MED ORDER — TRAZODONE HCL 50 MG PO TABS: 50.0000 mg | ORAL_TABLET | Freq: Every evening | ORAL | Status: DC | PRN

## 2024-08-04 MED ORDER — HALOPERIDOL LACTATE 5 MG/ML IJ SOLN
10.0000 mg | Freq: Three times a day (TID) | INTRAMUSCULAR | Status: AC | PRN
  Filled 2024-08-04: qty 2

## 2024-08-04 MED ORDER — HALOPERIDOL LACTATE 5 MG/ML IJ SOLN: 5.0000 mg | Freq: Three times a day (TID) | INTRAMUSCULAR | Status: DC | PRN

## 2024-08-04 MED ORDER — HYDROXYZINE HCL 25 MG PO TABS: 25.0000 mg | ORAL_TABLET | Freq: Three times a day (TID) | ORAL | Status: DC | PRN

## 2024-08-04 MED ORDER — HALOPERIDOL LACTATE 5 MG/ML IJ SOLN: 10.0000 mg | Freq: Three times a day (TID) | INTRAMUSCULAR | Status: DC | PRN
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
## 2024-08-04 NOTE — BHH Group Notes (Signed)
 Adult Psychoeducational Group Note  Date:  08/04/2024 Time:  8:30 PM  Group Topic/Focus:  Wrap-Up Group:   The focus of this group is to help patients review their daily goal of treatment and discuss progress on daily workbooks.  Participation Level:  Did Not Attend  Participation Quality:  did not attend  Affect:  did not attend  Cognitive:  did not attend  Insight: did not attend  Engagement in Group:  did not attend  Modes of Intervention:  did not attend  Additional Comments:    Lang Donia Law 08/04/2024, 8:30 PM

## 2024-08-04 NOTE — ED Notes (Signed)
 Patient cooperative with skin assessment and being brought on the unit. At present, appears to be sleeping comfortably in recliner. Respirations equal and unlabored. No appeared or reported distress. Monitor for safety.

## 2024-08-04 NOTE — ED Provider Notes (Deleted)
 FBC/OBS ASAP Discharge Summary  Date and Time: 08/04/2024 11:50 AM  Name: Mario Perez  MRN:  [id number]   Discharge Diagnoses:  Final diagnoses:  Aggressive behavior  Homicidal ideation  Non compliance w medication regimen  Cannabis use disorder, severe, dependence (HCC)   Please see HPI note written by Alan Thurman GAILS, NP earljier today. Patient informed of transfer to inpatient. He was observed resting comfortably in recliner.   Total Time spent with patient: 30 minutes     Musculoskeletal  Strength & Muscle Tone: within normal limits Gait & Station: normal Patient leans: N/A  Psychiatric Specialty Exam  Presentation  General Appearance:  Casual  Eye Contact: Fair  Speech: Pressured; Clear and Coherent  Speech Volume: Increased  Handedness: Right   Mood and Affect  Mood: Anxious  Affect: Congruent   Thought Process  Thought Processes: Linear  Descriptions of Associations:Intact  Orientation:Full (Time, Place and Person)  Thought Content:WDL  Diagnosis of Schizophrenia or Schizoaffective disorder in past: No    Hallucinations:Hallucinations: None  Ideas of Reference:None  Suicidal Thoughts:Suicidal Thoughts: No  Homicidal Thoughts:Homicidal Thoughts: Yes, Active HI Active Intent and/or Plan: With Intent; With Plan   Sensorium  Memory: Immediate Fair  Judgment: Poor  Insight: Poor   Executive Functions  Concentration: Fair  Attention Span: Fair  Recall: [employer] of Knowledge: Fair  Language: Fair   Psychomotor Activity  Psychomotor Activity: Psychomotor Activity: Normal   Assets  Assets: Manufacturing Systems Engineer; Desire for Improvement   Sleep  Sleep: Sleep: Fair  Estimated Sleeping Duration (Last 24 Hours): 0.00 hours  Nutritional Assessment (For OBS and FBC admissions only) Has the patient had a weight loss or gain of 10 pounds or more in the last 3 months?: No Has the patient had a decrease  in food intake/or appetite?: No Does the patient have dental problems?: No Does the patient have eating habits or behaviors that may be indicators of an eating disorder including binging or inducing vomiting?: No Has the patient recently lost weight without trying?: 0 Has the patient been eating poorly because of a decreased appetite?: 0 Malnutrition Screening Tool Score: 0    Physical Exam  Physical Exam ROS Blood pressure 112/70, pulse 69, temperature 98.1 F (36.7 C), temperature source Oral, resp. rate 16, SpO2 100%. There is no height or weight on file to calculate BMI.  Disposition Transfer to [facility]  Prentice Espy, MD 08/04/2024, 11:50 AM
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
## 2024-08-04 NOTE — Plan of Care (Signed)
 Patient accepted to [facility] today for psychiatric stabilization. Please see HPI that was done earlier today for information regarding patient history and presentation.  Prentice Espy, MD Psychiatry
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
## 2024-08-04 NOTE — ED Notes (Signed)
Patient A&O x 4, ambulatory. Patient discharged in no acute distress. Patient denied SI/HI, A/VH upon discharge. Pt belongings returned to patient from locker #  intact. Patient escorted to lobby via staff for transport to destination. Safety maintained.

## 2024-08-04 NOTE — ED Notes (Signed)
 Pt appears to be sleeping in no acute distress. RR even and unlabored. Environment secured. Will continue to monitor for safety.

## 2024-08-04 NOTE — Plan of Care (Signed)
   Problem: Activity: Goal: Interest or engagement in activities will improve Outcome: Progressing   Problem: Coping: Goal: Ability to verbalize frustrations and anger appropriately will improve Outcome: Progressing Goal: Ability to demonstrate self-control will improve Outcome: Progressing
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
## 2024-08-05 LAB — URINALYSIS, ROUTINE W REFLEX MICROSCOPIC
Bilirubin Urine: NEGATIVE
Glucose, UA: NEGATIVE mg/dL
Hgb urine dipstick: NEGATIVE
Ketones, ur: NEGATIVE mg/dL
Leukocytes,Ua: NEGATIVE
Nitrite: NEGATIVE
Protein, ur: NEGATIVE mg/dL
Specific Gravity, Urine: 1.024 (ref 1.005–1.030)
pH: 7 (ref 5.0–8.0)

## 2024-08-05 MED ORDER — ARIPIPRAZOLE 5 MG PO TABS
5.0000 mg | ORAL_TABLET | Freq: Every day | ORAL | Status: AC
  Administered 2024-08-12 – 2024-08-13 (×2): 5 mg via ORAL
  Filled 2024-08-05: qty 1
  Filled 2024-08-05: qty 7
  Filled 2024-08-05 (×4): qty 1

## 2024-08-05 MED ORDER — FLUOXETINE HCL 10 MG PO CAPS
10.0000 mg | ORAL_CAPSULE | Freq: Every day | ORAL | Status: AC
  Administered 2024-08-12 – 2024-08-13 (×2): 10 mg via ORAL
  Filled 2024-08-05 (×6): qty 1
  Filled 2024-08-05: qty 7

## 2024-08-05 MED ORDER — ARIPIPRAZOLE 2 MG PO TABS
2.0000 mg | ORAL_TABLET | Freq: Once | ORAL | Status: DC
  Filled 2024-08-05: qty 1

## 2024-08-05 NOTE — Plan of Care (Signed)
   Problem: Education: Goal: Emotional status will improve Outcome: Not Progressing Goal: Mental status will improve Outcome: Not Progressing

## 2024-08-05 NOTE — Group Note (Signed)
 Date:  08/05/2024 Time:  10:03 AM  Group Topic/Focus: Nutrient group .Adequate nutrition plays a vital role in supporting mental health in adults. Key nutrient groups include omega-3 fatty acids, which support brain structure and mood regulation; B-complex vitamins, essential for neurotransmitter production and stress management; vitamin D , which is linked to emotional well-being; and important minerals such as magnesium , zinc, and iron that help regulate the nervous system and cognitive function. Protein is also critical, as it provides amino acids needed to produce brain chemicals like serotonin and dopamine, while antioxidants from fruits and vegetables protect brain cells from oxidative stress. Together, these nutrients support emotional balance, focus, and overall mental well-being.    Participation Level:  Did Not Attend   Mario Perez 08/05/2024, 10:03 AM

## 2024-08-05 NOTE — Progress Notes (Signed)
(  Sleep Hours) -8 (Any PRNs that were needed, meds refused, or side effects to meds)- no (Any disturbances and when (visitation, over night)-no (Concerns raised by the patient)- no (SI/HI/AVH)- denies

## 2024-08-05 NOTE — Group Note (Signed)
 Date:  08/05/2024 Time:  9:54 AM  Group Topic/Focus: goals and orientation Goals Group:   The focus of this group is to help patients establish daily goals to achieve during treatment and discuss how the patient can incorporate goal setting into their daily lives to aide in recovery. Orientation:   The focus of this group is to educate the patient on the purpose and policies of crisis stabilization and provide a format to answer questions about their admission.  The group details unit policies and expectations of patients while admitted.    Participation Level:  Did Not Attend  Dolores CHRISTELLA Fredericks 08/05/2024, 9:54 AM

## 2024-08-05 NOTE — Group Note (Signed)
 Date:  08/05/2024 Time:  2:12 PM  Group Topic/Focus: Social work  Begin your journey to better health and mental well being    Participation Level:  Did Not Attend   Mario Perez 08/05/2024, 2:12 PM

## 2024-08-05 NOTE — [facility] Suicide Risk Assessment (Signed)
 Suicide Risk Assessment  Admission Assessment    [facility] Admission Suicide Risk Assessment   Nursing information obtained from:  Patient  Demographic factors:  Male, Adolescent or young adult, Low socioeconomic status, Unemployed  Current Mental Status:  NA  Loss Factors:  Legal issues, Financial problems / change in socioeconomic status, Decrease in vocational status  Historical Factors:  Impulsivity  Risk Reduction Factors:  Sense of responsibility to family, Living with another person, especially a relative, Positive social support  Total Time spent with patient: 1.5 hours  Principal Problem: <principal problem not specified>  Diagnosis:  Active Problems:   Substance induced mood disorder (HCC)  Subjective Data:  See H&P.  Continued Clinical Symptoms:  Alcohol Use Disorder Identification Test Final Score (AUDIT): 3 The Alcohol Use Disorders Identification Test, Guidelines for Use in Primary Care, Second Edition.  [employer] Writer [facility]). Score between 0-7:  no or low risk or alcohol related problems. Score between 8-15:  moderate risk of alcohol related problems. Score between 16-19:  high risk of alcohol related problems. Score 20 or above:  warrants further diagnostic evaluation for alcohol dependence and treatment.  CLINICAL FACTORS:   Alcohol/Substance Abuse/Dependencies Unstable or Poor Therapeutic Relationship Previous Psychiatric Diagnoses and Treatments  Musculoskeletal: Strength & Muscle Tone: within normal limits Gait & Station: normal Patient leans: N/A  Psychiatric Specialty Exam:  Presentation  General Appearance:  Casual; Fairly Groomed  Eye Contact: Fair  Speech: Clear and Coherent  Speech Volume: Normal  Handedness: Right   Mood and Affect  Mood: Irritable  Affect: Congruent; Flat  Thought Process  Thought Processes: Coherent  Descriptions of Associations:Intact  Orientation:Full (Time, Place and Person)  Thought  Content:Logical  History of Schizophrenia/Schizoaffective disorder:No  Duration of Psychotic Symptoms: NA  Hallucinations:Hallucinations: None  Ideas of Reference:None  Suicidal Thoughts:Suicidal Thoughts: No  Homicidal Thoughts:Homicidal Thoughts: No HI Active Intent and/or Plan: With Intent; With Plan  Sensorium  Memory: Immediate Good; Recent Good; Remote Good  Judgment: Poor  Insight: Lacking  Executive Functions  Concentration: Fair  Attention Span: Fair  Recall: Good  Fund of Knowledge: Fair  Language: Good  Psychomotor Activity  Psychomotor Activity: Psychomotor Activity: Normal  Assets  Assets: Manufacturing Systems Engineer; Housing; Physical Health; Social Support  Sleep  Sleep: Sleep: Good Number of Hours of Sleep: 8  Physical Exam: See H&P. Blood pressure 111/83, pulse (!) 58, temperature 98.1 F (36.7 C), temperature source Oral, resp. rate 20, height 5' 9 (1.753 m), weight 70.8 kg, SpO2 100%. Body mass index is 23.04 kg/m.  COGNITIVE FEATURES THAT CONTRIBUTE TO RISK:  Polarized thinking and Thought constriction (tunnel vision)    SUICIDE RISK:   Patient currently denies any suicidal ideations or thoughts.  PLAN OF CARE: See H&P.  I certify that inpatient services furnished can reasonably be expected to improve the patient's condition.   Mac Bolster, NP, [id number],[id number]-bc. 08/05/2024, 12:41 PM

## 2024-08-05 NOTE — Group Note (Signed)
 Date:  08/05/2024 Time:  8:44 PM  Group Topic/Focus:  Wrap-Up Group:   The focus of this group is to help patients review their daily goal of treatment and discuss progress on daily workbooks.    Participation Level:  Did Not Attend  Participation Quality:  Resistant  Affect:  Resistant  Cognitive:  Lacking  Insight: None  Engagement in Group:  None  Modes of Intervention:  Discussion  Additional Comments:  Patient did not attend wrap up group this evening   Bari Moats 08/05/2024, 8:44 PM
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
## 2024-08-05 NOTE — Group Note (Signed)
 LCSW Group Therapy Note   Group Date: 08/05/2024 Start Time: 1100 End Time: 1200   Participation:  did not attend  Type of Therapy:  Group Therapy  Topic:  Lifestyle:  from One Day to Today is Day One  Objective:  To promote mental and physical well-being through lifestyle changes in routine, nutrition, sleep, and movement.  Goals: Increase awareness of how lifestyle habits impact mental health. Encourage one small, achievable wellness goal. Support group sharing and accountability.  Summary:  Group members explored how daily habits influence mental health and discussed the importance of starting with small, manageable changes. Participants identified personal goals and shared reflections on improving structure, sleep, diet, and physical activity.  Therapeutic Modalities: CBT - Identifying and challenging all-or-nothing thinking; promoting realistic, helpful thoughts about change. Psychoeducation - Teaching about the impact of sleep, nutrition, movement, and routine on mental health. Motivational Interviewing - Eliciting personal motivation and exploring readiness for change. Goal-Setting - Supporting SMART goals to build self-efficacy and encourage follow-through.   Shakerria Parran O Imajean Mcdermid, LCSWA 08/05/2024  12:41 PM

## 2024-08-05 NOTE — Progress Notes (Signed)
" °   08/05/24 0000  Psych Admission Type (Psych Patients Only)  Admission Status Involuntary  Psychosocial Assessment  Patient Complaints Anxiety;Irritability  Eye Contact Fair  Facial Expression Anxious  Affect Anxious  Speech Logical/coherent  Interaction Forwards little  Motor Activity Other (Comment) (WDL)  Appearance/Hygiene In scrubs  Behavior Characteristics Cooperative  Mood Anxious;Pleasant  Aggressive Behavior  Effect No apparent injury  Thought Process  Coherency Circumstantial  Content WDL  Delusions None reported or observed  Perception WDL  Hallucination None reported or observed  Judgment Limited  Confusion None  Danger to Self  Current suicidal ideation? Denies  Danger to Others  Danger to Others None reported or observed   DAR NOTE: Patient presents with calm affect and mood.  Denies suicidal thoughts, pain, auditory and visual hallucinations.  Rates depression at 0, hopelessness at 0, and anxiety at 0.  Maintained on routine safety checks.  Medications given as prescribed.  Support and encouragement offered as needed.  Did not attend group when invited.  States goal for today is talking.  Patient is isolative to his room.  Offered no complaint.    "
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
## 2024-08-05 NOTE — Plan of Care (Signed)
   Problem: Education: Goal: Knowledge of [facility] Education information/materials will improve Outcome: Progressing Goal: Emotional status will improve Outcome: Progressing Goal: Mental status will improve Outcome: Progressing Goal: Verbalization of understanding the information provided will improve Outcome: Progressing

## 2024-08-05 NOTE — Group Note (Signed)
 Date:  08/05/2024 Time:  2:22 PM  Group Topic/Focus: emotional wellness Emotional Education:   The focus of this group is to discuss what feelings/emotions are, and how they are experienced.    Participation Level:  Did Not Attend   Mario Perez 08/05/2024, 2:22 PM

## 2024-08-05 NOTE — Progress Notes (Signed)
 Estimated Sleeping Duration (Last 24 Hours): 6.50-8.75 hours (Sleep Hours) - (Any PRNs that were needed, meds refused, or side effects to meds)- none (Any disturbances and when (visitation, over night)- no (Concerns raised by the patient)- none (SI/HI/AVH)- denies

## 2024-08-05 NOTE — Group Note (Signed)
 Date:  08/05/2024 Time:  5:13 PM  Group Topic/Focus: Ot Therapy Sleep hygiene strategies    Participation Level:  Did Not Attend   Mario Perez 08/05/2024, 5:13 PM

## 2024-08-05 NOTE — Plan of Care (Signed)
  Problem: Education: Goal: Knowledge of [facility] Education information/materials will improve Outcome: Progressing   Problem: Activity: Goal: Interest or engagement in activities will improve Outcome: Progressing   

## 2024-08-06 ENCOUNTER — Encounter (HOSPITAL_COMMUNITY): Payer: Self-pay

## 2024-08-06 NOTE — Plan of Care (Signed)
   Problem: Education: Goal: Emotional status will improve Outcome: Not Progressing Goal: Mental status will improve Outcome: Not Progressing

## 2024-08-06 NOTE — Group Note (Signed)
 Date:  08/06/2024 Time:  10:15 PM  Group Topic/Focus:  Wrap-Up Group:   The focus of this group is to help patients review their daily goal of treatment and discuss progress on daily workbooks.    Participation Level:  Active  Participation Quality:  Appropriate  Affect:  Appropriate  Cognitive:  Appropriate  Insight: Appropriate  Engagement in Group:  Engaged  Modes of Intervention:  Discussion  Additional Comments:   Pt actively participated.   Mario Perez 08/06/2024, 10:15 PM

## 2024-08-06 NOTE — Progress Notes (Signed)
" °   08/06/24 1500  Psych Admission Type (Psych Patients Only)  Admission Status Involuntary  Psychosocial Assessment  Patient Complaints Anxiety  Eye Contact Fair  Facial Expression Anxious  Affect Anxious  Speech Soft  Interaction Assertive  Motor Activity Slow  Appearance/Hygiene Unremarkable  Behavior Characteristics Cooperative  Mood Depressed  Thought Process  Coherency WDL  Content WDL  Delusions None reported or observed  Perception WDL  Hallucination None reported or observed  Judgment WDL  Confusion Mild  Danger to Self  Current suicidal ideation? Denies  Danger to Others  Danger to Others None reported or observed    "

## 2024-08-06 NOTE — Group Note (Signed)
 Date:  08/06/2024 Time:  1:06 PM  Group Topic/Focus:  Goals Group:   The focus of this group is to help patients establish daily goals to achieve during treatment and discuss how the patient can incorporate goal setting into their daily lives to aide in recovery.    Participation Level:  Minimal  Participation Quality:  Appropriate  Affect:  Appropriate  Cognitive:  Appropriate  Insight: Appropriate  Engagement in Group:  Engaged  Modes of Intervention:  Discussion  Additional Comments:  Pt responded when prompted to answer questions.  Mario Perez 08/06/2024, 1:06 PM

## 2024-08-06 NOTE — BH IP Treatment Plan (Signed)
 Interdisciplinary Treatment and Diagnostic Plan Update  08/06/2024 Time of Session: 10:25 AM Mario Perez MRN: [id number]  Principal Diagnosis: Substance induced mood disorder (HCC)  Secondary Diagnoses: Principal Problem:   Substance induced mood disorder (HCC) Active Problems:   Cannabis use disorder, severe, dependence (HCC)   Current Medications:  Current Facility-Administered Medications  Medication Dose Route Frequency Provider Last Rate Last Admin   acetaminophen  (TYLENOL ) tablet 650 mg  650 mg Oral Q6H PRN Lynnette Barter, MD       alum & mag hydroxide-simeth (MAALOX/MYLANTA) 200-200-20 MG/5ML suspension 30 mL  30 mL Oral Q4H PRN Lynnette Barter, MD       ARIPiprazole  (ABILIFY ) tablet 2 mg  2 mg Oral Once Nwoko, Agnes I, NP       ARIPiprazole  (ABILIFY ) tablet 5 mg  5 mg Oral Daily Nwoko, Agnes I, NP       haloperidol  (HALDOL ) tablet 5 mg  5 mg Oral TID PRN Lynnette Barter, MD       And   diphenhydrAMINE  (BENADRYL ) capsule 50 mg  50 mg Oral TID PRN Lynnette Barter, MD       haloperidol  lactate (HALDOL ) injection 10 mg  10 mg Intramuscular TID PRN Lynnette Barter, MD       And   diphenhydrAMINE  (BENADRYL ) injection 50 mg  50 mg Intramuscular TID PRN Lynnette Barter, MD       And   LORazepam  (ATIVAN ) injection 2 mg  2 mg Intramuscular TID PRN Lynnette Barter, MD       haloperidol  lactate (HALDOL ) injection 5 mg  5 mg Intramuscular TID PRN Lynnette Barter, MD       And   diphenhydrAMINE  (BENADRYL ) injection 50 mg  50 mg Intramuscular TID PRN Lynnette Barter, MD       And   LORazepam  (ATIVAN ) injection 2 mg  2 mg Intramuscular TID PRN Lynnette Barter, MD       FLUoxetine  (PROZAC ) capsule 10 mg  10 mg Oral Daily Nwoko, Agnes I, NP       hydrOXYzine  (ATARAX ) tablet 25 mg  25 mg Oral TID PRN Lynnette Barter, MD       magnesium  hydroxide (MILK OF MAGNESIA) suspension 30 mL  30 mL Oral Daily PRN Lynnette Barter, MD       nicotine  (NICODERM CQ  - dosed in mg/24 hours) patch 14 mg  14 mg Transdermal Q0600 Lynnette Barter, MD       traZODone   (DESYREL ) tablet 50 mg  50 mg Oral QHS PRN Lynnette Barter, MD       PTA Medications: Medications Prior to Admission  Medication Sig Dispense Refill Last Dose/Taking   FLUoxetine  (PROZAC ) 10 MG capsule Take 1 capsule (10 mg total) by mouth daily. 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   Physician Treatment Plan for Primary Diagnosis: Substance induced mood disorder (HCC) Long Term Goal(s): Improvement in symptoms so as ready for discharge   Short Term Goals: Ability to identify and develop effective coping behaviors will improve Ability to maintain clinical measurements within normal limits will improve Compliance with prescribed medications will improve Ability to identify triggers associated with substance  abuse/mental health issues will improve Ability to identify changes in lifestyle to reduce recurrence of condition will improve Ability to verbalize feelings will improve Ability to disclose and discuss suicidal ideas Ability to demonstrate self-control will improve  Medication Management: Evaluate patient's response, side effects, and tolerance of medication regimen.  Therapeutic Interventions: 1 to 1 sessions, Unit Group sessions and Medication administration.  Evaluation of Outcomes: Not Progressing  Physician Treatment Plan for Secondary Diagnosis: Principal Problem:   Substance induced mood disorder (HCC) Active Problems:   Cannabis use disorder, severe, dependence (HCC)  Long Term Goal(s): Improvement in symptoms so as ready for discharge   Short Term Goals: Ability to identify and  develop effective coping behaviors will improve Ability to maintain clinical measurements within normal limits will improve Compliance with prescribed medications will improve Ability to identify triggers associated with substance abuse/mental health issues will improve Ability to identify changes in lifestyle to reduce recurrence of condition will improve Ability to verbalize feelings will improve Ability to disclose and discuss suicidal ideas Ability to demonstrate self-control will improve     Medication Management: Evaluate patient's response, side effects, and tolerance of medication regimen.  Therapeutic Interventions: 1 to 1 sessions, Unit Group sessions and Medication administration.  Evaluation of Outcomes: Not Progressing   RN Treatment Plan for Primary Diagnosis: Substance induced mood disorder (HCC) Long Term Goal(s): Knowledge of disease and therapeutic regimen to maintain health will improve  Short Term Goals: Ability to remain free from injury will improve, Ability to verbalize frustration and anger appropriately will improve, Ability to demonstrate self-control, Ability to participate in decision making will improve, Ability to verbalize feelings will improve, Ability to disclose and discuss suicidal ideas, Ability to identify and develop effective coping behaviors will improve, and Compliance with prescribed medications will improve  Medication Management: RN will administer medications as ordered by provider, will assess and evaluate patient's response and provide education to patient for prescribed medication. RN will report any adverse and/or side effects to prescribing provider.  Therapeutic Interventions: 1 on 1 counseling sessions, Psychoeducation, Medication administration, Evaluate responses to treatment, Monitor vital signs and CBGs as ordered, Perform/monitor CIWA, COWS, AIMS and Fall Risk screenings as ordered, Perform wound care treatments as ordered.  Evaluation  of Outcomes: Not Progressing   LCSW Treatment Plan for Primary Diagnosis: Substance induced mood disorder (HCC) Long Term Goal(s): Safe transition to appropriate next level of care at discharge, Engage patient in therapeutic group addressing interpersonal concerns.  Short Term Goals: Engage patient in aftercare planning with referrals and resources, Increase social support, Increase ability to appropriately verbalize feelings, Increase emotional regulation, Facilitate acceptance of mental health diagnosis and concerns, Facilitate patient progression through stages of change regarding substance use diagnoses and concerns, Identify triggers associated with mental health/substance abuse issues, and Increase skills for wellness and recovery  Therapeutic Interventions: Assess for all discharge needs, 1 to 1 time with Social worker, Explore available resources and support systems, Assess for adequacy in community support network, Educate family and significant other(s) on suicide prevention, Complete Psychosocial Assessment, Interpersonal group therapy.  Evaluation of Outcomes: Not Progressing   Progress in Treatment: Attending groups: No. Participating in groups: No. Taking medication as prescribed: patient hasn't been prescribed any medications yet  Toleration medication:  N/A Family/Significant other contact made: No, will contact:  Mother, Hamdi Kley [phone] Patient understands diagnosis: Yes. Discussing patient identified problems/goals with staff: Yes. Medical problems stabilized or resolved: Yes. Denies suicidal/homicidal ideation: Yes. Issues/concerns per patient self-inventory: No.  New  problem(s) identified:  No  New Short Term/Long Term Goal(s):  Patient Goals:  I would like to be referred to a therapist to work on my reactions.  Discharge Plan or Barriers:  Patient recently admitted. CSW will continue to follow and assess for appropriate referrals and possible discharge  planning.    Reason for Continuation of Hospitalization: Medication stabilization Suicidal ideation  Estimated Length of Stay:  5 - 7 days  Last 3 [place] Suicide Severity Risk Score: Flowsheet Row [facility]Current[facility] Most recent reading at 08/04/2024  4:25 PM ED from 08/04/2024 in [facility] Most recent reading at 08/04/2024  5:28 AM Ad[facility] (Discharged) [facility] Most recent reading at 02/28/2024 11:06 PM  C-SSRS RISK CATEGORY No Risk No Risk No Risk    Last PHQ 2/9 Scores:     No data to display          Scribe for Treatment Team: Leif Loflin O Seletha Zimmermann, LCSW 08/06/2024 4:46 PM

## 2024-08-06 NOTE — Group Note (Signed)
 Recreation Therapy Group Note   Group Topic:Communication  Group Date: 08/06/2024 Start Time: 0934 End Time: 1006 Facilitators: Osa Fogarty-McCall, LRT,CTRS Location: [address] Dayroom   Group Topic: Communication, Problem Solving   Goal Area(s) Addresses:  Patient will effectively listen to complete activity.  Patient will identify communication skills used to make activity successful.  Patient will identify how skills used during activity can be used to reach post d/c goals.    Behavioral Response: Moderate  Intervention: Building Surveyor Activity - Geometric pattern cards, pencils, blank paper    Activity: Geometric Drawings.  Three volunteers from the peer group will be shown an abstract picture with a particular arrangement of geometrical shapes.  Each round, one 'speaker' will describe the pattern, as accurately as possible without revealing the image to the group.  The remaining group members will listen and draw the picture to reflect how it is described to them. Patients with the role of 'listener' cannot ask clarifying questions but, may request that the speaker repeat a direction. Once the drawings are complete, the presenter will show the rest of the group the picture and compare how close each person came to drawing the picture. LRT will facilitate a post-activity discussion regarding effective communication and the importance of planning, listening, and asking for clarification in daily interactions with others.  Education: Environmental consultant, Active listening, Support systems, Discharge planning  Education Outcome: Acknowledges understanding/In group clarification offered/Needs additional education.    Affect/Mood: Appropriate   Participation Level: Moderate   Participation Quality: Independent   Behavior: Appropriate   Speech/Thought Process: Relevant   Insight: Moderate   Judgement: Moderate   Modes of Intervention: Activity   Patient Response  to Interventions:  Engaged   Education Outcome:  In group clarification offered    Clinical Observations/Individualized Feedback: Pt was quiet and focused during group. Pt made attempts to complete the drawings but would try to get clarity when needed. Pt was receptive during group.      Plan: Continue to engage patient in RT group sessions 2-3x/week.   Saniya Tranchina-McCall, LRT,CTRS 08/06/2024 11:58 AM

## 2024-08-06 NOTE — Group Note (Signed)
 Date:  08/06/2024 Time:  5:07 PM  Group Topic/Focus:  Wellness Toolbox:   The focus of this group is to discuss Stress Management Strategies. An interactive group where patients participate in a discussion on stress management skills.  Participation Level:  Active  Participation Quality:  Attentive  Affect:  Appropriate  Cognitive:  Alert  Insight: Appropriate  Engagement in Group:  Engaged  Modes of Intervention:  Activity, Discussion, and Socialization   Mario Perez [name] 08/06/2024, 5:07 PM

## 2024-08-07 NOTE — Progress Notes (Signed)
" °   08/07/24 0900  Psych Admission Type (Psych Patients Only)  Admission Status Involuntary  Psychosocial Assessment  Patient Complaints None  Eye Contact Fair  Facial Expression Flat  Affect Appropriate to circumstance  Speech Logical/coherent  Interaction Minimal  Motor Activity Other (Comment) (level 3 observation)  Appearance/Hygiene Excess accessories;Unremarkable  Behavior Characteristics Cooperative;Calm  Mood Pleasant  Aggressive Behavior  Effect No apparent injury  Thought Process  Coherency WDL  Content WDL  Delusions None reported or observed  Perception WDL  Hallucination None reported or observed  Judgment WDL  Confusion None  Danger to Self  Current suicidal ideation? Denies  Danger to Others  Danger to Others None reported or observed    "

## 2024-08-07 NOTE — Plan of Care (Signed)
   Problem: Education: Goal: Emotional status will improve Outcome: Progressing Goal: Mental status will improve Outcome: Progressing

## 2024-08-07 NOTE — Plan of Care (Signed)
   Problem: Activity: Goal: Interest or engagement in activities will improve Outcome: Progressing

## 2024-08-07 NOTE — Progress Notes (Signed)
(  Sleep Hours) -8.5 (Any PRNs that were needed, meds refused, or side effects to meds)- none (Any disturbances and when (visitation, over night)-none (Concerns raised by the patient)- none (SI/HI/AVH)-denies all

## 2024-08-07 NOTE — [facility] Group Notes (Signed)
 Adult Psychoeducational Group Note  Date:  08/07/2024 Time:  9:15 PM  Group Topic/Focus:  Wrap-Up Group:   The focus of this group is to help patients review their daily goal of treatment and discuss progress on daily workbooks.  Participation Level:  Active  Participation Quality:  Appropriate  Affect:  Appropriate  Cognitive:  Appropriate  Insight: Appropriate  Engagement in Group:  Engaged  Modes of Intervention:  Discussion  Additional Comments: Pt attended group.   Drue Pouch 08/07/2024, 9:15 PM

## 2024-08-07 NOTE — Group Note (Signed)
 [facility] LCSW Group Therapy Note   Group Date: 08/07/2024 Start Time: 0955 End Time: 1020   Type of Therapy/Topic:  Group Therapy:  Balance in Life  Participation Level:  Did Not Attend   Description of Group:    This group will address the concept of balance and how it feels and looks when one is unbalanced. Patients will be encouraged to process areas in their lives that are out of balance, and identify reasons for remaining unbalanced. Facilitators will guide patients utilizing problem- solving interventions to address and correct the stressor making their life unbalanced. Understanding and applying boundaries will be explored and addressed for obtaining  and maintaining a balanced life. Patients will be encouraged to explore ways to assertively make their unbalanced needs known to significant others in their lives, using other group members and facilitator for support and feedback.  Therapeutic Goals: Patient will identify two or more emotions or situations they have that consume much of in their lives. Patient will identify signs/triggers that life has become out of balance:  Patient will identify two ways to set boundaries in order to achieve balance in their lives:  Patient will demonstrate ability to communicate their needs through discussion and/or role plays  Summary of Patient Progress:    Pt was invited but did not attend    Therapeutic Modalities:   Cognitive Behavioral Therapy Solution-Focused Therapy Assertiveness Training   Golda Louder, LCSW

## 2024-08-07 NOTE — Group Note (Signed)
 Date:  08/07/2024 Time:  6:40 PM  Group Topic/Focus:  Making Healthy Choices:   The focus of this group is to help patients identify negative/unhealthy choices they were using prior to admission and identify positive/healthier coping strategies to replace them upon discharge. How choices in diet affect our moods.    Participation Level:  Active  Participation Quality:  Attentive  Affect:  Appropriate  Cognitive:  Appropriate  Insight: Appropriate  Engagement in Group:  Engaged  Modes of Intervention:  Discussion and Education  Additional Comments:    Juliene CHRISTELLA Huddle 08/07/2024, 6:40 PM

## 2024-08-07 NOTE — [facility] Group Notes (Signed)
 Adult Psychoeducational Group Note  Date:  08/07/2024 Time:  9:21 AM  Group Topic/Focus:  Goals Group:   The focus of this group is to help patients establish daily goals to achieve during treatment and discuss how the patient can incorporate goal setting into their daily lives to aide in recovery. Orientation:   The focus of this group is to educate the patient on the purpose and policies of crisis stabilization and provide a format to answer questions about their admission.  The group details unit policies and expectations of patients while admitted.  Participation Level:  Did Not Attend  [name] 08/07/2024, 9:21 AM

## 2024-08-07 NOTE — Progress Notes (Signed)
" °   08/06/24 2130  Psych Admission Type (Psych Patients Only)  Admission Status Involuntary  Psychosocial Assessment  Patient Complaints None  Eye Contact Fair  Facial Expression Flat  Affect Appropriate to circumstance  Speech Logical/coherent  Interaction Minimal  Motor Activity Other (Comment) (wnl)  Appearance/Hygiene Unremarkable  Behavior Characteristics Cooperative  Mood Pleasant  Thought Process  Coherency WDL  Content WDL  Delusions None reported or observed  Perception WDL  Hallucination None reported or observed  Judgment WDL  Confusion None  Danger to Self  Current suicidal ideation? Denies  Danger to Others  Danger to Others None reported or observed    "
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
## 2024-08-08 NOTE — Progress Notes (Signed)
" °   08/08/24 1000  Psych Admission Type (Psych Patients Only)  Admission Status Involuntary  Psychosocial Assessment  Patient Complaints None  Eye Contact Fair  Facial Expression Flat  Affect Appropriate to circumstance  Speech Soft;Logical/coherent  Interaction Minimal;Forwards little  Motor Activity Slow  Appearance/Hygiene Unremarkable  Behavior Characteristics Cooperative;Calm  Mood Pleasant  Aggressive Behavior  Effect No apparent injury  Thought Process  Coherency WDL  Content WDL  Delusions None reported or observed  Perception WDL  Hallucination None reported or observed  Judgment WDL  Confusion None  Danger to Self  Current suicidal ideation? Denies  Danger to Others  Danger to Others None reported or observed    "

## 2024-08-08 NOTE — Group Note (Signed)
 Date:  08/08/2024 Time:  11:14 PM  Group Topic/Focus:  Wrap-Up Group:   The focus of this group is to help patients review their daily goal of treatment and discuss progress on daily workbooks.    Participation Level:  Active  Participation Quality:  Appropriate  Affect:  Appropriate  Cognitive:  Appropriate  Insight: Appropriate  Engagement in Group:  Engaged  Modes of Intervention:  Discussion  Additional Comments:  Patient participated in wrap up group this evening.  Patient rated his day a 8 out of 10.     Bari Moats 08/08/2024, 11:14 PM
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
## 2024-08-08 NOTE — Plan of Care (Signed)
   Problem: Education: Goal: Emotional status will improve Outcome: Progressing   Problem: Activity: Goal: Interest or engagement in activities will improve Outcome: Progressing

## 2024-08-08 NOTE — Group Note (Signed)
 Date:  08/08/2024 Time:  9:08 AM  Group Topic/Focus:  Goals Group:   The focus of this group is to help patients establish daily goals to achieve during treatment and discuss how the patient can incorporate goal setting into their daily lives to aide in recovery. Orientation:   The focus of this group is to educate the patient on the purpose and policies of crisis stabilization and provide a format to answer questions about their admission.  The group details unit policies and expectations of patients while admitted.    Participation Level:  Active  Participation Quality:  Appropriate  Affect:  Appropriate  Cognitive:  Appropriate  Insight: Appropriate  Engagement in Group:  Engaged  Modes of Intervention:  Activity  Additional Comments:  Pt attended group and participated.   Manessa Buley 08/08/2024, 9:08 AM

## 2024-08-08 NOTE — Plan of Care (Addendum)
 Patient's Nursing Progress Note  Sleep hours: 9.25 hrs Any PRNs that were needed, meds refused, or side effects to medications: No PRNs given overnight. Appears to not be taking scheduled meds and asked patient about the reasoning behind that. Patient stated that he feels he does not need it. Had voiced that the last time he was here in the hospital, he had voiced feeling depressed and was put on Fluoxetine . No longer depressed.  Any disturbances and when (visitation, overnight): None Concerns raised by the patient: Not needing to be on Fluoxetine . Ready to go home. Inquired about discharge plans. SI/HI/AVH: Denies all.  Problem: Activity: Goal: Sleeping patterns will improve Outcome: Progressing   Problem: Coping: Goal: Ability to demonstrate self-control will improve Outcome: Progressing
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
## 2024-08-08 NOTE — Group Note (Signed)
 Date:  08/08/2024 Time:  10:43 AM  Group Topic/Focus: Emotional Wellness and Managing Emotions  Group Topic: Emotional Wellness and the Impact of Emotional Regulation on Physical Health  Description:  Today, the group participated in a session focused on emotional wellness and the importance of managing one's emotions to prevent emotional and physical distress. The facilitator began by showing a video that introduced the concept of emotional regulation, explaining how our emotions can affect both our mental and physical health. The video highlighted the connection between chronic emotional suppression or unmanaged emotional states and the onset of various health conditions, such as cardiovascular disease, autoimmune disorders, and mental health struggles like anxiety and depression.  After viewing the video, the group engaged in a discussion about the various strategies to manage emotions effectively. Topics included mindfulness techniques, the importance of self-awareness, and recognizing emotional triggers. Participants were encouraged to practice acknowledging their feelings in a non-judgmental way, rather than suppressing them, and to seek healthy outlets for expression (such as journaling, talking to a trusted person, or engaging in physical activity).    Participation Level:  Active  Participation Quality:  Appropriate  Affect:  Appropriate  Cognitive:  Appropriate  Insight: Appropriate  Engagement in Group:  Engaged  Modes of Intervention:  Activity  Additional Comments:  Pt attended group and participated.   Sacha Radloff 08/08/2024, 10:43 AM

## 2024-08-08 NOTE — [facility] Suicide Risk Assessment (Signed)
 [facility] INPATIENT:  Family/Significant Other Suicide Prevention Education  Suicide Prevention Education:  Contact Attempts: (Mother) Mario Perez [phone], (name of family member/significant other) has been identified by the patient as the family member/significant other with whom the patient will be residing, and identified as the person(s) who will aid the patient in the event of a mental health crisis.  With written consent from the patient, two attempts were made to provide suicide prevention education, prior to and/or following the patient's discharge.  We were unsuccessful in providing suicide prevention education.  A suicide education pamphlet was given to the patient to share with family/significant other.  Date and time of first attempt:2/1/@1448   Mario Perez 08/08/2024, 2:48 PM

## 2024-08-08 NOTE — Progress Notes (Signed)
" °   08/08/24 2032  Psych Admission Type (Psych Patients Only)  Admission Status Involuntary  Psychosocial Assessment  Patient Complaints None  Eye Contact Fair  Facial Expression Flat  Affect Appropriate to circumstance  Speech Logical/coherent  Interaction Minimal  Motor Activity Slow  Appearance/Hygiene Unremarkable  Behavior Characteristics Cooperative;Appropriate to situation  Mood Pleasant  Thought Process  Coherency WDL  Content WDL  Delusions None reported or observed  Perception WDL  Hallucination None reported or observed  Judgment WDL  Confusion None  Danger to Self  Current suicidal ideation? Denies    "

## 2024-08-08 NOTE — Group Note (Signed)
 Date:  08/08/2024 Time:  7:45 PM  Group Topic/Focus:  Making Healthy Choices:   The focus of this group is to help patients identify negative/unhealthy choices they were using prior to admission and identify positive/healthier coping strategies to replace them upon discharge.   Participation Level:  Active  Participation Quality:  Attentive  Affect:  Appropriate  Cognitive:  Appropriate  Insight: Appropriate  Engagement in Group:  Engaged  Modes of Intervention:  Discussion and Education  Additional Comments:    Juliene CHRISTELLA Huddle 08/08/2024, 7:45 PM

## 2024-08-09 ENCOUNTER — Encounter (HOSPITAL_COMMUNITY): Payer: Self-pay

## 2024-08-09 NOTE — [facility] Group Notes (Signed)
 Patient attended Recreational Therapy Group.

## 2024-08-09 NOTE — Group Note (Signed)
 Date:  08/09/2024 Time:  9:43 AM  Group Topic/Focus:  Goals Group:   The focus of this group is to help patients establish daily goals to achieve during treatment and discuss how the patient can incorporate goal setting into their daily lives to aide in recovery.    Participation Level:  Active  Participation Quality:  Appropriate  Affect:  Appropriate  Cognitive:  Appropriate  Insight: Good  Engagement in Group:  Engaged  Modes of Intervention:  Discussion  Additional Comments:  Patient attended and participated in Goals Group.  Rosaleen BIRCH Tyrrell Stephens 08/09/2024, 9:43 AM

## 2024-08-09 NOTE — Plan of Care (Signed)
   Problem: Activity: Goal: Interest or engagement in activities will improve Outcome: Progressing

## 2024-08-09 NOTE — Progress Notes (Signed)
(  Sleep Hours) -9.75 (Any PRNs that were needed, meds refused, or side effects to meds)- none (Any disturbances and when (visitation, over night)-none (Concerns raised by the patient)- none (SI/HI/AVH)- denies all

## 2024-08-09 NOTE — Plan of Care (Signed)
   Problem: Education: Goal: Knowledge of [facility] Education information/materials will improve Outcome: Progressing Goal: Emotional status will improve Outcome: Progressing Goal: Mental status will improve Outcome: Progressing Goal: Verbalization of understanding the information provided will improve Outcome: Progressing

## 2024-08-10 NOTE — [facility] Group Notes (Signed)
 Patient did attend Pet Therapy Group.

## 2024-08-10 NOTE — Group Note (Signed)
 Date:  08/10/2024 Time:  9:40 AM  Group Topic/Focus:  Goals Group:   The focus of this group is to help patients establish daily goals to achieve during treatment and discuss how the patient can incorporate goal setting into their daily lives to aide in recovery.    Participation Level:  Active  Participation Quality:  Appropriate  Affect:  Appropriate  Cognitive:  Appropriate  Insight: Appropriate  Engagement in Group:  Engaged  Modes of Intervention:  Discussion  Additional Comments:  Patient particpated and attended Goals Group.  Rosaleen BIRCH Kenya Shiraishi 08/10/2024, 9:40 AM

## 2024-08-10 NOTE — Group Note (Signed)
 Date:  08/10/2024 Time:  1:51 PM  Group Topic/Focus: Mental Health Video Emotional Education:   The focus of this group is to discuss what feelings/emotions are, and how they are experienced.    Participation Level:  Active  Participation Quality:  Appropriate  Affect:  Appropriate  Cognitive:  Appropriate  Insight: Appropriate  Engagement in Group:  Engaged  Modes of Intervention:  Education  Additional Comments:  Patient attended and participated in discussion of mental health video.  Rosaleen BIRCH Coriana Angello 08/10/2024, 1:51 PM

## 2024-08-10 NOTE — Progress Notes (Signed)
(  Sleep Hours) - (Any PRNs that were needed, meds refused, or side effects to meds)- None (Any disturbances and when (visitation, over night)- None (Concerns raised by the patient)- None (SI/HI/AVH)- Denies. Contracts for safety

## 2024-08-10 NOTE — Plan of Care (Signed)
   Problem: Education: Goal: Emotional status will improve Outcome: Progressing Goal: Mental status will improve Outcome: Progressing

## 2024-08-10 NOTE — [facility] Group Notes (Signed)
 Patient did attend Social Work Group.

## 2024-08-10 NOTE — Plan of Care (Signed)
   Problem: Safety: Goal: Ability to remain free from injury will improve Outcome: Progressing

## 2024-08-10 NOTE — Plan of Care (Signed)
  Problem: Activity: Goal: Interest or engagement in activities will improve Outcome: Progressing   Problem: Safety: Goal: Periods of time without injury will increase Outcome: Progressing

## 2024-08-10 NOTE — Group Note (Signed)
 Recreation Therapy Group Note   Group Topic:Animal Assisted Therapy   Group Date: 08/10/2024 Start Time: 9053 End Time: 1030 Facilitators: Tyreon Frigon-McCall, LRT,CTRS Location: [address] Dayroom   Animal-Assisted Activity (AAA) Program Checklist/Progress Notes Patient Eligibility Criteria Checklist & Daily Group note for Rec Tx Intervention  AAA/T Program Assumption of Risk Form signed by Patient/ or Parent Legal Guardian Yes  Patient is free of allergies or severe asthma Yes  Patient reports no fear of animals Yes  Patient reports no history of cruelty to animals Yes  Patient understands his/her participation is voluntary Yes  Patient washes hands before animal contact Yes  Patient washes hands after animal contact Yes  Behavioral Response: Engaged   Education: Charity Fundraiser, Appropriate Animal Interaction   Education Outcome: Acknowledges education.    Affect/Mood: Appropriate   Participation Level: Engaged   Participation Quality: Independent   Behavior: Appropriate   Speech/Thought Process: Focused   Insight: Good   Judgement: Good   Modes of Intervention: Teaching Laboratory Technician   Patient Response to Interventions:  Engaged   Education Outcome:  In group clarification offered    Clinical Observations/Individualized Feedback: Patient attended session and interacted appropriately with therapy dog and peers. Patient asked appropriate questions about therapy dog and his training. Patient shared stories about their pets at home with group.     Plan: Continue to engage patient in RT group sessions 2-3x/week.   Clorene Nerio-McCall, LRT,CTRS 08/10/2024 11:49 AM

## 2024-08-10 NOTE — Progress Notes (Signed)
 1530: Patient slammed the hallway phone down. Then came to nursing station yelling and demanding nurse to call his aunt so she can put hands on her. Nurse proceeds to ask who was he talking about his mother. He started yelling for us  to call his aunt. Patient was pacing agitated. Nurse asked could she give him some medication to help with his agitation and he declined. Patient to dayroom to get his snack.

## 2024-08-10 NOTE — Progress Notes (Signed)
 Mario [facility] [place]   Type of Note: Dispo planning  Spoke with patient this afternoon and provided list of shelters. Encouraged patient to call and put his name on wait lists as needed.  Pt states he talked with his mom on the phone yesterday and it did not go well. Pt has been trying to call her again today and she will not pick up the phone.   Pt aware of potential [facility] discharge if unable to secure other arrangements. Pt was agreeable but stated he is hopeful his mom will allow him back home, even though he refuses to take mental health medications.   No needs from CSW at this time.  Signed:  Glorianne Proctor, LCSW-A 08/10/2024  3:18 PM

## 2024-08-10 NOTE — Group Note (Signed)
 Date:  08/10/2024 Time:  6:34 PM  Group Topic/Focus:  Making Healthy Choices:   The focus of this group is to help patients identify negative/unhealthy choices they were using prior to admission and identify positive/healthier coping strategies to replace them upon discharge. Taught actions that lead to a healthy brain.    Participation Level:  Active  Participation Quality:  Inattentive  Affect:  Angry  Cognitive:  Lacking  Insight: Limited  Engagement in Group:  Distracting  Modes of Intervention:  Discussion and Education  Additional Comments:  Towards the end of the class pt was mildly agitated got up and crunched up the teaching material and threw it away and started yelling in the hallway.  Mario Perez Huddle 08/10/2024, 6:34 PM

## 2024-08-11 NOTE — Plan of Care (Signed)
   Problem: Education: Goal: Knowledge of [facility] Education information/materials will improve Outcome: Progressing Goal: Emotional status will improve Outcome: Progressing Goal: Mental status will improve Outcome: Progressing Goal: Verbalization of understanding the information provided will improve Outcome: Progressing   Problem: Activity: Goal: Interest or engagement in activities will improve Outcome: Progressing

## 2024-08-11 NOTE — [facility] Group Notes (Signed)
 [facility] Group Notes:  (Nursing/MHT/Case Management/Adjunct)  Date:  08/11/2024  Time: 2000  Type of Therapy:  Wrap up group  Participation Level:  Active  Participation Quality:  Appropriate, Attentive, Sharing, and Supportive  Affect:  Appropriate  Cognitive:  Alert  Insight:  Improving  Engagement in Group:  Engaged  Modes of Intervention:  Clarification, Education, and Socialization  Summary of Progress/Problems: Positive thinking and self-care were discussed.   Lenora Shaver S 08/11/2024, 9:22 PM

## 2024-08-11 NOTE — Group Note (Signed)
 Date:  08/11/2024 Time:  9:30 AM  Group Topic/Focus:  Goals Group:   The focus of this group is to help patients establish daily goals to achieve during treatment and discuss how the patient can incorporate goal setting into their daily lives to aide in recovery.    Participation Level:  Active  Participation Quality:  Appropriate  Affect:  Appropriate  Cognitive:  Appropriate  Insight: Appropriate  Engagement in Group:  Engaged  Modes of Intervention:  Activity  Additional Comments:    Mario Perez 08/11/2024, 9:30 AM

## 2024-08-11 NOTE — Progress Notes (Signed)
 Patient denies SI, HI, AVH. Patient stated they slept Good last night. Scored zero on anxiety, feeling of hopelessness, and depression. Patient goal is Talking to my child psychotherapist and figuring out discharge. Keep staying positive and states will Ask questions, keep participating in group to help reach that goal. Patient has been calm and cooperative.     08/11/24 1500  Psych Admission Type (Psych Patients Only)  Admission Status Involuntary  Psychosocial Assessment  Patient Complaints None  Eye Contact Fair  Facial Expression Flat  Affect Appropriate to circumstance  Speech Logical/coherent  Interaction Assertive  Motor Activity Other (Comment) (WDL)  Appearance/Hygiene Unremarkable  Behavior Characteristics Cooperative  Mood Pleasant  Thought Process  Coherency WDL  Content WDL  Delusions None reported or observed  Perception WDL  Hallucination None reported or observed  Judgment Impaired  Confusion None  Danger to Self  Current suicidal ideation? Denies  Danger to Others  Danger to Others None reported or observed

## 2024-08-11 NOTE — Group Note (Signed)
 Date:  08/11/2024 Time:  3:56 PM  Group Topic/Focus: Write a letter to your past or future self  Emotional Education:   The focus of this group is to discuss what feelings/emotions are, and how they are experienced.    Participation Level:  Active  Participation Quality:  Appropriate  Affect:  Appropriate  Cognitive:  Appropriate  Insight: Appropriate  Engagement in Group:  Engaged  Modes of Intervention:  Discussion  Additional Comments:  Patient engaged appropriately during group.  Murice Barbar D Michial Disney 08/11/2024, 3:56 PM

## 2024-08-11 NOTE — Group Note (Signed)
 Recreation Therapy Group Note   Group Topic:Communication  Group Date: 08/11/2024 Start Time: 0930 End Time: 1000 Facilitators: Nohelia Valenza-McCall, LRT,CTRS Location: [address] Dayroom   Group Topic: Communication, Team Building, Problem Solving  Goal Area(s) Addresses:  Patient will effectively work with peer towards shared goal.  Patient will identify skills used to make activity successful.  Patient will identify how skills used during activity can be applied to reach post d/c goals.   Behavioral Response: Active  Intervention: STEM Activity- Glass Blower/designer  Activity: Tallest [employer]. In teams of 5-6, patients were given 11 craft pipe cleaners. Using the materials provided, patients were instructed to compete again the opposing team(s) to build the tallest free-standing structure from floor level. The activity was timed; difficulty increased by clinical research associate as production designer, theatre/television/film continued.  Systematically resources were removed with additional directions for example, placing one arm behind their back, working in silence, and shape stipulations. LRT facilitated post-activity discussion reviewing team processes and necessary communication skills involved in completion. Patients were encouraged to reflect how the skills utilized, or not utilized, in this activity can be incorporated to positively impact support systems post discharge.  Education: Pharmacist, Community, Scientist, Physiological, Discharge Planning   Education Outcome: Acknowledges education/In group clarification offered/Needs additional education.    Affect/Mood: Flat   Participation Level: Active   Participation Quality: Independent   Behavior: Cooperative   Speech/Thought Process: Relevant   Insight: Moderate   Judgement: Moderate   Modes of Intervention: STEM Activity   Patient Response to Interventions:  Receptive   Education Outcome:  In group clarification offered    Clinical  Observations/Individualized Feedback: Pt was flat and appeared down about something. Pt was quiet but participated in activity. Pt was attentive and made attempts to help complete the activity.     Plan: Continue to engage patient in RT group sessions 2-3x/week.   Frederick Marro-McCall, LRT,CTRS 08/11/2024 12:54 PM
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
## 2024-08-12 NOTE — Plan of Care (Signed)
   Problem: Safety: Goal: Periods of time without injury will increase Outcome: Progressing

## 2024-08-12 NOTE — BHH Group Notes (Signed)
 Patient attended Nutrition Group.

## 2024-08-12 NOTE — Group Note (Signed)
 Recreation Therapy Group Note   Group Topic:Other  Group Date: 08/12/2024 Start Time: 1303 End Time: 1347 Facilitators: Melayna Robarts-McCall, LRT,CTRS Location: 300 Hall Dayroom   Activity Description/Intervention: Therapeutic Drumming. Patients with peers and staff were given the opportunity to engage in a leader facilitated [facility] Group Empowerment Drumming Circle with staff from the [employer], in partnership with [employer]. Teaching laboratory technician and trained [place], Norleen Mon leading with LRT observing and documenting intervention and pt response. This evidenced-based practice targets 7 areas of health and wellbeing in the human experience including: stress-reduction, exercise, self-expression, camaraderie/support, nurturing, spirituality, and music-making (leisure).   Goal Area(s) Addresses:  Patient will engage in pro-social way in music group.  Patient will follow directions of drum leader on the first prompt. Patient will demonstrate no behavioral issues during group.  Patient will identify if a reduction in stress level occurs as a result of participation in therapeutic drum circle.    Education: Leisure exposure, Pharmacologist, Musical expression, Discharge Planning    Affect/Mood: Flat   Participation Level: Engaged   Participation Quality: Independent   Behavior: Cooperative   Speech/Thought Process: Barely audible    Insight: Fair   Judgement: Fair    Modes of Intervention: Teaching Laboratory Technician   Patient Response to Interventions:  Receptive   Education Outcome:  In group clarification offered    Clinical Observations/Individualized Feedback: Mario Perez actively engaged in therapeutic drumming exercise and discussions. Pt was appropriate with peers, staff, and musical equipment for duration of programming.  Pt  couldn't identify any feelings after participation in music-based programming. Pt affect incongruent throughout group.     Plan: Continue to engage patient in RT group sessions 2-3x/week.   Mario Perez, LRT,CTRS 08/12/2024 2:59 PM

## 2024-08-12 NOTE — Progress Notes (Signed)
(  Sleep Hours) - 7.5 (Any PRNs that were needed, meds refused, or side effects to meds)- None (Any disturbances and when (visitation, over night)- None (Concerns raised by the patient)- None (SI/HI/AVH)- Denies. Contracts for safety

## 2024-08-12 NOTE — Plan of Care (Signed)
   Problem: Education: Goal: Knowledge of [facility] Education information/materials will improve Outcome: Progressing Goal: Emotional status will improve Outcome: Progressing Goal: Mental status will improve Outcome: Progressing Goal: Verbalization of understanding the information provided will improve Outcome: Progressing

## 2024-08-12 NOTE — [facility] Group Notes (Signed)
 Patient did attend Social Work Group.

## 2024-08-12 NOTE — BHH Group Notes (Signed)
 B[facility] Group Notes:  (Nursing/MHT/Case Management/Adjunct)  Date:  08/12/2024  Time:  2000  Type of Therapy:  Wrap up group  Participation Level:  Active  Participation Quality:  Appropriate, Attentive, and Sharing  Affect:  Appropriate  Cognitive:  Alert  Insight:  Improving  Engagement in Group:  Engaged  Modes of Intervention:  Clarification, Education, and Support  Summary of Progress/Problems: Positive thinking and positive change were discussed.   Lenora Shaver S 08/12/2024, 9:07 PM

## 2024-08-12 NOTE — Group Note (Signed)
 LCSW Group Therapy Note   Group Date: 08/12/2024 Start Time: 1100 End Time: 1200   Participation:  patient was present and actively participated in the discussion.  Type of Therapy:  Group Therapy   Topic:  Money Matters: Creating Stability, Confidence and Peace of Mind  Objective: To help participants understand the impact of financial stability on well-being through the lens of Maslow's Hierarchy of Needs and develop practical strategies for budgeting, saving, and debt repayment.  Goals: Increase awareness of spending habits and financial priorities, recognizing how money supports basic needs, security, and relationships. Develop simple budgeting and saving strategies to enhance stability and peace of mind.  Reduce financial stress by creating a realistic debt repayment plan, supporting long-term confidence and well-being.  Summary:  Participants explored how financial stability connects to basic needs, relationships, and self-esteem using Maslow's Hierarchy. They discussed budgeting, saving, and debt repayment strategies, identifying small, manageable changes. Through interactive discussion and self-reflection, they gained insight into their financial habits and created personal action steps for improvement.  Therapeutic Modalities Used: Elements of Cognitive Behavioral Therapy (CBT) - Addressing financial stress and thought patterns. Psychoeducation - Engineer, agricultural. Elements of Motivational Interviewing (MI) - Encouraging realistic, achievable changes. Group Support - Reducing shame and stress through shared experiences.   Adeeb Konecny O Andreya Lacks, LCSW 08/12/2024  12:55 PM
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
## 2024-08-12 NOTE — Group Note (Signed)
 Occupational Therapy Group Note  Group Topic:Coping Skills  Group Date: 08/12/2024 Start Time: 1500 End Time: 1530 Facilitators: Dot [place] MATSU, OT   Group Description: Group encouraged increased engagement and participation through discussion and activity focused on Coping Ahead. Patients were split up into teams and selected a card from a stack of positive coping strategies. Patients were instructed to act out/charade the coping skill for other peers to guess and receive points for their team. Discussion followed with a focus on identifying additional positive coping strategies and patients shared how they were going to cope ahead over the weekend while continuing hospitalization stay.  Therapeutic Goal(s): Identify positive vs negative coping strategies. Identify coping skills to be used during hospitalization vs coping skills outside of hospital/at home Increase participation in therapeutic group environment and promote engagement in treatment   Participation Level: Engaged   Participation Quality: Independent   Behavior: Isolative   Speech/Thought Process: Barely audible   Affect/Mood: Flat   Insight: Limited   Judgement: Limited      Modes of Intervention: Education  Patient Response to Interventions:  Disengaged   Plan: Continue to engage patient in OT groups 2 - 3x/week.  08/12/2024  [place] MATSU Dot, OT  Winner Valeriano, OT

## 2024-08-12 NOTE — [facility] Group Notes (Signed)
 Patient did attend Music Therapy Group.

## 2024-08-12 NOTE — Progress Notes (Signed)
 Patient denies SI, HI, AVH. Patient stated they slept Good last night. Scored 0/10 on anxiety, feeling of hopelessness, and depression. Patient goal is to Stay positive and talk if needed and states will stay chill to help reach that goal. Patient has been calm, cooperative, and med compliant.     08/12/24 1100  Psych Admission Type (Psych Patients Only)  Admission Status Involuntary  Psychosocial Assessment  Patient Complaints None  Eye Contact Fair  Facial Expression Flat  Affect Appropriate to circumstance  Speech Logical/coherent  Interaction Assertive  Motor Activity Other (Comment) (WDL)  Appearance/Hygiene Unremarkable  Behavior Characteristics Cooperative  Mood Pleasant  Thought Process  Coherency WDL  Content WDL  Delusions None reported or observed  Perception WDL  Hallucination None reported or observed  Judgment Impaired  Confusion None  Danger to Self  Current suicidal ideation? Denies  Danger to Others  Danger to Others None reported or observed

## 2024-08-12 NOTE — [facility] Group Notes (Signed)
 Patient did attend Occupational Therapy Group.

## 2024-08-12 NOTE — Group Note (Signed)
 Date:  08/12/2024 Time:  9:52 AM  Group Topic/Focus: Goals Group Goals Group:   The focus of this group is to help patients establish daily goals to achieve during treatment and discuss how the patient can incorporate goal setting into their daily lives to aide in recovery.    Participation Level:  Active  Participation Quality:  Appropriate  Affect:  Appropriate  Cognitive:  Appropriate  Insight: Appropriate  Engagement in Group:  Engaged  Modes of Intervention:  Discussion  Additional Comments:  Patient attended and participated in Goals Group.  Rosaleen BIRCH Skylur Fuston 08/12/2024, 9:52 AM

## 2024-08-13 ENCOUNTER — Encounter (HOSPITAL_COMMUNITY): Payer: Self-pay

## 2024-08-13 NOTE — Progress Notes (Signed)
(  Sleep Hours) -6.5 as of 0530 (Any PRNs that were needed, meds refused, or side effects to meds)- none (Any disturbances and when (visitation, over night)-none (Concerns raised by the patient)- none (SI/HI/AVH)- denies all
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
## 2024-08-13 NOTE — BH IP Treatment Plan (Signed)
 Interdisciplinary Treatment and Diagnostic Plan Update  08/13/2024 Time of Session: THIS IS AN UPDATE Mario Perez MRN: [id number]  Principal Diagnosis: Substance induced mood disorder (HCC)  Secondary Diagnoses: Principal Problem:   Substance induced mood disorder (HCC) Active Problems:   Cannabis use disorder, severe, dependence (HCC)   Current Medications:  Current Facility-Administered Medications  Medication Dose Route Frequency Provider Last Rate Last Admin   acetaminophen  (TYLENOL ) tablet 650 mg  650 mg Oral Q6H PRN Lynnette Barter, MD   650 mg at 08/11/24 1152   alum & mag hydroxide-simeth (MAALOX/MYLANTA) 200-200-20 MG/5ML suspension 30 mL  30 mL Oral Q4H PRN Lynnette Barter, MD       ARIPiprazole  (ABILIFY ) tablet 5 mg  5 mg Oral Daily Nwoko, Agnes I, NP   5 mg at 08/13/24 9063   haloperidol  (HALDOL ) tablet 5 mg  5 mg Oral TID PRN Lynnette Barter, MD       And   diphenhydrAMINE  (BENADRYL ) capsule 50 mg  50 mg Oral TID PRN Lynnette Barter, MD       haloperidol  lactate (HALDOL ) injection 10 mg  10 mg Intramuscular TID PRN Lynnette Barter, MD       And   diphenhydrAMINE  (BENADRYL ) injection 50 mg  50 mg Intramuscular TID PRN Lynnette Barter, MD       And   LORazepam  (ATIVAN ) injection 2 mg  2 mg Intramuscular TID PRN Lynnette Barter, MD       haloperidol  lactate (HALDOL ) injection 5 mg  5 mg Intramuscular TID PRN Lynnette Barter, MD       And   diphenhydrAMINE  (BENADRYL ) injection 50 mg  50 mg Intramuscular TID PRN Lynnette Barter, MD       And   LORazepam  (ATIVAN ) injection 2 mg  2 mg Intramuscular TID PRN Lynnette Barter, MD       FLUoxetine  (PROZAC ) capsule 10 mg  10 mg Oral Daily Nwoko, Agnes I, NP   10 mg at 08/13/24 9063   hydrOXYzine  (ATARAX ) tablet 25 mg  25 mg Oral TID PRN Lynnette Barter, MD   25 mg at 08/12/24 1356   magnesium  hydroxide (MILK OF MAGNESIA) suspension 30 mL  30 mL Oral Daily PRN Lynnette Barter, MD       nicotine  (NICODERM CQ  - dosed in mg/24 hours) patch 14 mg  14 mg Transdermal Q0600 Lynnette Barter, MD        traZODone  (DESYREL ) tablet 50 mg  50 mg Oral QHS PRN Lynnette Barter, MD       PTA Medications: Medications Prior to Admission  Medication Sig Dispense Refill Last Dose/Taking   FLUoxetine  (PROZAC ) 10 MG capsule Take 1 capsule (10 mg total) by mouth daily. 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   Physician Treatment Plan for Primary Diagnosis: Substance induced mood disorder (HCC) Long Term Goal(s): Improvement in symptoms so as ready for discharge   Short Term Goals: Ability to identify and develop effective coping behaviors will improve Ability to maintain clinical measurements within normal limits will improve Compliance with prescribed medications will improve Ability to identify triggers associated with substance abuse/mental health issues will improve Ability  to identify changes in lifestyle to reduce recurrence of condition will improve Ability to verbalize feelings will improve Ability to disclose and discuss suicidal ideas Ability to demonstrate self-control will improve  Medication Management: Evaluate patient's response, side effects, and tolerance of medication regimen.  Therapeutic Interventions: 1 to 1 sessions, Unit Group sessions and Medication administration.  Evaluation of Outcomes: Progressing  Physician Treatment Plan for Secondary Diagnosis: Principal Problem:   Substance induced mood disorder (HCC) Active Problems:   Cannabis use disorder, severe, dependence (HCC)  Long Term Goal(s): Improvement in symptoms so as ready for discharge   Short Term Goals: Ability to identify  and develop effective coping behaviors will improve Ability to maintain clinical measurements within normal limits will improve Compliance with prescribed medications will improve Ability to identify triggers associated with substance abuse/mental health issues will improve Ability to identify changes in lifestyle to reduce recurrence of condition will improve Ability to verbalize feelings will improve Ability to disclose and discuss suicidal ideas Ability to demonstrate self-control will improve     Medication Management: Evaluate patient's response, side effects, and tolerance of medication regimen.  Therapeutic Interventions: 1 to 1 sessions, Unit Group sessions and Medication administration.  Evaluation of Outcomes: Progressing   RN Treatment Plan for Primary Diagnosis: Substance induced mood disorder (HCC) Long Term Goal(s): Knowledge of disease and therapeutic regimen to maintain health will improve  Short Term Goals: Ability to remain free from injury will improve, Ability to verbalize frustration and anger appropriately will improve, Ability to demonstrate self-control, Ability to participate in decision making will improve, Ability to verbalize feelings will improve, Ability to disclose and discuss suicidal ideas, Ability to identify and develop effective coping behaviors will improve, and Compliance with prescribed medications will improve  Medication Management: RN will administer medications as ordered by provider, will assess and evaluate patient's response and provide education to patient for prescribed medication. RN will report any adverse and/or side effects to prescribing provider.  Therapeutic Interventions: 1 on 1 counseling sessions, Psychoeducation, Medication administration, Evaluate responses to treatment, Monitor vital signs and CBGs as ordered, Perform/monitor CIWA, COWS, AIMS and Fall Risk screenings as ordered, Perform wound care treatments as ordered.  Evaluation  of Outcomes: Progressing   LCSW Treatment Plan for Primary Diagnosis: Substance induced mood disorder (HCC) Long Term Goal(s): Safe transition to appropriate next level of care at discharge, Engage patient in therapeutic group addressing interpersonal concerns.  Short Term Goals: Engage patient in aftercare planning with referrals and resources, Increase social support, Increase ability to appropriately verbalize feelings, Increase emotional regulation, Facilitate acceptance of mental health diagnosis and concerns, Facilitate patient progression through stages of change regarding substance use diagnoses and concerns, Identify triggers associated with mental health/substance abuse issues, and Increase skills for wellness and recovery  Therapeutic Interventions: Assess for all discharge needs, 1 to 1 time with Social worker, Explore available resources and support systems, Assess for adequacy in community support network, Educate family and significant other(s) on suicide prevention, Complete Psychosocial Assessment, Interpersonal group therapy.  Evaluation of Outcomes: Progressing   Progress in Treatment: Attending groups: Yes. Participating in groups: Yes. Taking medication as prescribed: Yes Toleration medication: Yes Family/Significant other contact made: Yes, contactedt:  (Mother) Ryon Layton [phone] Patient understands diagnosis: Yes. Discussing patient identified problems/goals with staff: Yes. Medical problems stabilized or resolved: Yes. Denies suicidal/homicidal ideation: Yes. Issues/concerns per patient self-inventory: No.   New problem(s) identified:  No   New Short Term/Long Term Goal(s):     medication stabilization, elimination  of SI thoughts, development of comprehensive mental wellness plan.      Patient Goals:  I want to be more talkative and peaceful in certain situations, and accept things for what they are.    Discharge Plan or Barriers:  Patient recently  admitted. CSW will continue to follow and assess for appropriate referrals and possible discharge planning.    Reason for Continuation of Hospitalization: Medication stabilization Suicidal ideation   Estimated Length of Stay:  3-5 days  Last 3 [place] Suicide Severity Risk Score: Flowsheet Row [facility] Most recent reading at 08/04/2024  4:25 PM ED from 08/04/2024 in [facility] Most recent reading at 08/04/2024  5:28 AM Admission (Discharged) [facility] Most recent reading at 02/28/2024 11:06 PM  C-SSRS RISK CATEGORY No Risk No Risk No Risk    Last PHQ 2/9 Scores:     No data to display          Scribe for Treatment Team: Travers Goodley  Nunez-Uva, LCSWA 08/13/2024 4:02 PM

## 2024-08-13 NOTE — Group Note (Signed)
 Date:  08/13/2024 Time:  11:05 AM  Group Topic/Focus: Goals and orientation Goals Group:   The focus of this group is to help patients establish daily goals to achieve during treatment and discuss how the patient can incorporate goal setting into their daily lives to aide in recovery. Orientation:   The focus of this group is to educate the patient on the purpose and policies of crisis stabilization and provide a format to answer questions about their admission.  The group details unit policies and expectations of patients while admitted.    Participation Level:  Active  Participation Quality:  Appropriate  Affect:  Appropriate  Cognitive:  Alert  Insight: Appropriate  Engagement in Group:  Engaged  Modes of Intervention:  Discussion  Additional Comments:    Dolores CHRISTELLA Fredericks 08/13/2024, 11:05 AM

## 2024-08-13 NOTE — Plan of Care (Signed)
?  Problem: Coping: ?Goal: Ability to verbalize frustrations and anger appropriately will improve ?Outcome: Progressing ?Goal: Ability to demonstrate self-control will improve ?Outcome: Progressing ?  ?Problem: Health Behavior/Discharge Planning: ?Goal: Compliance with treatment plan for underlying cause of condition will improve ?Outcome: Progressing ?  ?

## 2024-08-13 NOTE — Group Note (Signed)
 Date:  08/13/2024 Time:  12:01 PM  Group Topic/Focus: Social wellness Wellness Toolbox:   The focus of this group is to discuss various aspects of wellness, balancing those aspects and exploring ways to increase the ability to experience wellness.  Patients will create a wellness toolbox for use upon discharge.    Participation Level:  Active  Participation Quality:  Appropriate  Affect:  Appropriate  Cognitive:  Alert  Insight: Appropriate  Engagement in Group:  Engaged  Modes of Intervention:  Discussion  Additional Comments:    Dolores CHRISTELLA Fredericks 08/13/2024, 12:01 PM

## 2024-08-13 NOTE — Group Note (Signed)
 Recreation Therapy Group Note   Group Topic:General Recreation  Group Date: 08/13/2024 Start Time: 0932 End Time: 1006 Facilitators: Harith Mccadden-McCall, LRT,CTRS Location: [address] Day[address]   Group Topic/Focus: General Recreation   Goal Area(s) Addresses:  Patient will use appropriate game play with peers.   Patient will follow the rules of the game.  Behavioral Response: Minimal   Intervention: Karaoke  Activity: Grab The Mic. LRT selects a word from the stack. Patients then had to sing a song lyric with the word printed on the card. Each card has a number in the top corner, if a patient got the correct answer, the number is the amount of points they get. The person with the most points at the end, wins the game.     Affect/Mood: Flat   Participation Level: Minimal   Participation Quality: Independent   Behavior: Nonchalant   Speech/Thought Process: Relevant   Insight: Fair   Judgement: Fair    Modes of Intervention: Competitive Play   Patient Response to Interventions:  Receptive   Education Outcome:  In group clarification offered    Clinical Observations/Individualized Feedback: Pt was quiet throughout group. Pt was minimally engaged in activity. Pt made attempts to complete the twice during group. Pt was appropriate during group.      Plan: Continue to engage patient in RT group sessions 2-3x/week.   [employer] [employer]-McCall, LRT,CTRS 08/13/2024 11:55 AM

## 2024-08-13 NOTE — Progress Notes (Signed)
 Mario Perez   Type of Note: Discussion with pt's mother   Pt requested this writer call his mom to discuss discharge plans and his ability to return home at discharge. Pt's mother called last night requesting information about the [facility] and was given some information from his RN.   This clinical research associate called mom this morning, Mario Perez [phone]. Pts mother is requesting patient stay through the weekend due to him agreeing to take medication yesterday for the first time since admission. Mother reports telling pt yesterday that at discharge he will go to the [facility] until it closes and then she will give him a place to sleep at night. Her expectation is that while she is at work, he is not in the home and he continues with therapy at discharge.   Mother would like a phone call from a provider regarding pt's discharge date. MD notified of mother's request.   Signed:  Escher Harr, LCSW-A 08/13/2024  8:04 AM

## 2024-08-13 NOTE — Group Note (Signed)
 Date:  08/13/2024 Time:  9:01 PM  Group Topic/Focus:  Wrap-Up Group:   The focus of this group is to help patients review their daily goal of treatment and discuss progress on daily workbooks.    Participation Level:  Active  Participation Quality:  Appropriate  Affect:  Appropriate  Cognitive:  Appropriate  Insight: Appropriate  Engagement in Group:  Engaged  Modes of Intervention:  Discussion  Additional Comments:  Patient participated in wrap up group.  Patient completed assigned ditto sheet   Bari Moats 08/13/2024, 9:01 PM

## 2024-08-13 NOTE — Plan of Care (Signed)
   Problem: Education: Goal: Knowledge of [facility] Education information/materials will improve Outcome: Progressing Goal: Emotional status will improve Outcome: Progressing Goal: Mental status will improve Outcome: Progressing Goal: Verbalization of understanding the information provided will improve Outcome: Progressing

## 2024-08-13 NOTE — Group Note (Signed)
 Date:  08/13/2024 Time:  11:17 AM  Group Topic/Focus: Grab the mic Patients get a word and think of a lyric with that word in it.    Participation Level:  Active   Mario Perez M Curby Carswell 08/13/2024, 11:17 AM

## 2024-08-13 NOTE — Group Note (Signed)
 Date:  08/13/2024 Time:  8:11 PM  Group Topic/Focus:  Making Healthy Choices:   The focus of this group is to help patients identify negative/unhealthy choices they were using prior to admission and identify positive/healthier coping strategies to replace them upon discharge. In context of how diet and microbiome affect mental health.    Participation Level:  None  Participation Quality:  Inattentive  Affect:  Angry  Cognitive:  Alert  Insight: Limited  Engagement in Group:  None  Modes of Intervention:  Discussion and Education  Additional Comments:    Juliene CHRISTELLA Huddle 08/13/2024, 8:11 PM
